# Patient Record
Sex: Female | Born: 1975 | Race: Black or African American | Hispanic: No | Marital: Married | Smoking: Never smoker
Health system: Southern US, Community
[De-identification: ages and names within clinical notes are randomized; demographics above are authoritative.]

## PROBLEM LIST (undated history)

## (undated) DIAGNOSIS — O09529 Supervision of elderly multigravida, unspecified trimester: Secondary | ICD-10-CM

## (undated) DIAGNOSIS — Z8619 Personal history of other infectious and parasitic diseases: Secondary | ICD-10-CM

## (undated) DIAGNOSIS — D369 Benign neoplasm, unspecified site: Secondary | ICD-10-CM

## (undated) HISTORY — DX: Personal history of other infectious and parasitic diseases: Z86.19

## (undated) HISTORY — DX: Supervision of elderly multigravida, unspecified trimester: O09.529

## (undated) HISTORY — PX: OTHER SURGICAL HISTORY: SHX169

## (undated) HISTORY — DX: Benign neoplasm, unspecified site: D36.9

---

## 2005-12-13 ENCOUNTER — Other Ambulatory Visit: Admission: RE | Admit: 2005-12-13 | Discharge: 2005-12-13 | Payer: Self-pay | Admitting: Gynecology

## 2006-12-17 ENCOUNTER — Other Ambulatory Visit: Admission: RE | Admit: 2006-12-17 | Discharge: 2006-12-17 | Payer: Self-pay | Admitting: Gynecology

## 2008-01-10 ENCOUNTER — Other Ambulatory Visit: Admission: RE | Admit: 2008-01-10 | Discharge: 2008-01-10 | Payer: Self-pay | Admitting: Gynecology

## 2008-06-25 ENCOUNTER — Ambulatory Visit: Payer: Self-pay | Admitting: Women's Health

## 2009-02-10 ENCOUNTER — Ambulatory Visit: Payer: Self-pay | Admitting: Women's Health

## 2009-02-10 ENCOUNTER — Encounter: Payer: Self-pay | Admitting: Women's Health

## 2009-02-10 ENCOUNTER — Other Ambulatory Visit: Admission: RE | Admit: 2009-02-10 | Discharge: 2009-02-10 | Payer: Self-pay | Admitting: Gynecology

## 2009-09-22 ENCOUNTER — Emergency Department (HOSPITAL_COMMUNITY): Admission: EM | Admit: 2009-09-22 | Discharge: 2009-09-23 | Payer: Self-pay | Admitting: Emergency Medicine

## 2010-07-01 ENCOUNTER — Ambulatory Visit: Payer: Self-pay | Admitting: Women's Health

## 2010-07-01 ENCOUNTER — Other Ambulatory Visit: Admission: RE | Admit: 2010-07-01 | Discharge: 2010-07-01 | Payer: Self-pay | Admitting: Gynecology

## 2010-08-28 ENCOUNTER — Emergency Department (HOSPITAL_COMMUNITY)
Admission: EM | Admit: 2010-08-28 | Discharge: 2010-08-28 | Payer: Self-pay | Source: Home / Self Care | Admitting: Emergency Medicine

## 2010-09-01 ENCOUNTER — Ambulatory Visit (HOSPITAL_COMMUNITY)
Admission: RE | Admit: 2010-09-01 | Discharge: 2010-09-01 | Payer: Self-pay | Source: Home / Self Care | Attending: Chiropractic Medicine | Admitting: Chiropractic Medicine

## 2010-11-03 LAB — DIFFERENTIAL
Basophils Absolute: 0 10*3/uL (ref 0.0–0.1)
Eosinophils Absolute: 0.1 10*3/uL (ref 0.0–0.7)
Eosinophils Relative: 1 % (ref 0–5)
Lymphs Abs: 3.4 10*3/uL (ref 0.7–4.0)
Monocytes Relative: 10 % (ref 3–12)
Neutro Abs: 2.5 10*3/uL (ref 1.7–7.7)

## 2010-11-03 LAB — BASIC METABOLIC PANEL
Calcium: 9.1 mg/dL (ref 8.4–10.5)
Creatinine, Ser: 0.82 mg/dL (ref 0.4–1.2)
GFR calc Af Amer: >60 mL/min (ref >60)

## 2010-11-03 LAB — CBC
Hemoglobin: 14.3 g/dL (ref 12.0–15.0)
RBC: 4.57 MIL/uL (ref 3.87–5.11)
RDW: 12.8 % (ref 11.5–15.5)
WBC: 6.7 10*3/uL (ref 4.0–10.5)

## 2010-11-03 LAB — URINALYSIS, ROUTINE W REFLEX MICROSCOPIC
Bilirubin Urine: NEGATIVE
Protein, ur: NEGATIVE mg/dL
Specific Gravity, Urine: 1.014 (ref 1.005–1.030)
Urobilinogen, UA: 1 mg/dL (ref 0.0–1.0)

## 2010-11-03 LAB — POCT PREGNANCY, URINE: Preg Test, Ur: NEGATIVE

## 2010-11-03 LAB — URINE MICROSCOPIC-ADD ON

## 2011-10-18 ENCOUNTER — Emergency Department (HOSPITAL_COMMUNITY)
Admission: EM | Admit: 2011-10-18 | Discharge: 2011-10-19 | Disposition: A | Discharge status: Home / Self Care | Payer: BC Managed Care – PPO | Attending: Emergency Medicine | Admitting: Emergency Medicine

## 2011-10-18 ENCOUNTER — Encounter (HOSPITAL_COMMUNITY): Payer: Self-pay | Admitting: Emergency Medicine

## 2011-10-18 DIAGNOSIS — N949 Unspecified condition associated with female genital organs and menstrual cycle: Secondary | ICD-10-CM | POA: Insufficient documentation

## 2011-10-18 DIAGNOSIS — N83201 Unspecified ovarian cyst, right side: Secondary | ICD-10-CM

## 2011-10-18 DIAGNOSIS — N898 Other specified noninflammatory disorders of vagina: Secondary | ICD-10-CM | POA: Insufficient documentation

## 2011-10-18 DIAGNOSIS — N83209 Unspecified ovarian cyst, unspecified side: Secondary | ICD-10-CM | POA: Insufficient documentation

## 2011-10-18 DIAGNOSIS — R102 Pelvic and perineal pain: Secondary | ICD-10-CM

## 2011-10-18 MED ORDER — ONDANSETRON HCL 4 MG/2ML IJ SOLN
4.0000 mg | Freq: Once | INTRAMUSCULAR | Status: AC
  Administered 2011-10-18: 4 mg via INTRAVENOUS
  Filled 2011-10-18: qty 2

## 2011-10-18 MED ORDER — SODIUM CHLORIDE 0.9 % IV SOLN
INTRAVENOUS | Status: DC
  Administered 2011-10-18: via INTRAVENOUS

## 2011-10-18 MED ORDER — HYDROMORPHONE HCL PF 1 MG/ML IJ SOLN
1.0000 mg | Freq: Once | INTRAMUSCULAR | Status: AC
  Administered 2011-10-18: 1 mg via INTRAVENOUS
  Filled 2011-10-18: qty 1

## 2011-10-18 NOTE — ED Notes (Signed)
Pt stated that she could not urinate. 

## 2011-10-18 NOTE — ED Provider Notes (Signed)
History     CSN: [id number]  Arrival date & time 10/18/11  2259   First MD Initiated Contact with Patient 10/18/11 2320      No chief complaint on file.   (Consider location/radiation/quality/duration/timing/severity/associated sxs/prior treatment) HPI Comments: Jessica Cowan is a 36 y.o. Female who developed right low back pain, today, then it radiated to the right lower abdomen. The pain is ongoing and unremitting. She denies change in bowel or urinary habits. Her last menstrual period was one week ago. She has had no fever. She has nausea without vomiting. No diarrhea. She has not tried any medication for the problem. The problem is worse when standing and walking. She has never had it before.  The history is provided by the patient.    History reviewed. No pertinent past medical history.  History reviewed. No pertinent past surgical history.  Family History  Problem Relation Age of Onset  . Hypertension Other   . Diabetes Other   . Hyperlipidemia Other   . Cancer Other     History  Substance Use Topics  . Smoking status: Not on file  . Smokeless tobacco: Not on file  . Alcohol Use: Yes     occassional    OB History    Grav Para Term Preterm Abortions TAB SAB Ect Mult Living                  Review of Systems  All other systems reviewed and are negative.    Allergies  Review of patient's allergies indicates no known allergies.  Home Medications   Current Outpatient Rx  Name Route Sig Dispense Refill  . ACETAMINOPHEN 500 MG PO TABS Oral Take 1,000 mg by mouth every 6 (six) hours as needed.    . SEASONIQUE PO Oral Take 1 tablet by mouth every morning.    . OXYCODONE-ACETAMINOPHEN 5-325 MG PO TABS Oral Take 1 tablet by mouth every 4 (four) hours as needed for pain. 20 tablet 0    BP 106/62  Pulse 77  Temp(Src) 98.9 F (37.2 C) (Oral)  Resp 15  SpO2 100%  LMP 10/11/2011  Physical Exam  Nursing note and vitals reviewed. Constitutional: She is  oriented to person, place, and time. She appears well-developed and well-nourished. She appears distressed.       The patient is very uncomfortable with the pain, rocking, while sitting in bed and when ambulating walks flexed at the waist.  HENT:  Head: Normocephalic and atraumatic.  Eyes: Conjunctivae and EOM are normal. Pupils are equal, round, and reactive to light.  Neck: Normal range of motion and phonation normal. Neck supple.  Cardiovascular: Normal rate, regular rhythm and intact distal pulses.   Pulmonary/Chest: Effort normal and breath sounds normal. She exhibits no tenderness.  Abdominal: Soft. She exhibits no distension. There is no tenderness. There is no guarding.       There is no abdominal tenderness.  Genitourinary: Vaginal discharge (creamy, opaque) found.       No costovertebral angle tenderness. Uterus tender, not enlarged, and left adenexal tenderness, moderate; on bimanual exam.  Musculoskeletal: Normal range of motion.  Neurological: She is alert and oriented to person, place, and time. She has normal strength. She exhibits normal muscle tone.  Skin: Skin is warm and dry.  Psychiatric: She has a normal mood and affect. Her behavior is normal. Judgment and thought content normal.    ED Course  Procedures (including critical care time) Initial impression, urolithiasis with obstruction, versus  PID. Plan imaging with CT if urine pregnancy is negative.  5:39 AM Reevaluation with update and discussion. After initial assessment and treatment, an updated evaluation reveals persistent pain requiring 3rd dose Dilaudid. Pelvic exam done to evaluate pain- more left than right-sided. Pelvic U/S ordered . Stephane Junkins L   5:39 AM Reevaluation with update and discussion. After initial assessment and treatment, an updated evaluation reveals ultrasound results have returned. No evidence of ovarian torsion. Patient is being treated with one more dose of Dilaudid prior to discharge. She  has an appointment with her chronic constant, 9:30 this morning. Johanthan Kneeland L   Labs Reviewed  URINALYSIS, ROUTINE W REFLEX MICROSCOPIC - Abnormal; Notable for the following:    Specific Gravity, Urine 1.039 (*)    Ketones, ur 15 (*)    All other components within normal limits  WET PREP, GENITAL - Abnormal; Notable for the following:    WBC, Wet Prep HPF POC FEW (*)    All other components within normal limits  PREGNANCY, URINE  GC/CHLAMYDIA PROBE AMP, GENITAL   Ct Abdomen Pelvis Wo Contrast  10/19/2011  *RADIOLOGY REPORT*  Clinical Data: Right lower quadrant pain question kidney stone, negative pregnancy test  CT ABDOMEN AND PELVIS WITHOUT CONTRAST  Technique:  Multidetector CT imaging of the abdomen and pelvis was performed following the standard protocol without intravenous contrast. Sagittal and coronal MPR images reconstructed from axial data set.  Comparison: None  Findings: Lung bases clear. Tiny nonobstructing calculi right kidney. Multiple pelvic phleboliths. No definite hydronephrosis, ureteral dilatation, or ureteral calculus. Within limitations of a nonenhanced exam, no additional focal abnormalities of the liver, spleen, pancreas, kidneys, or adrenal glands identified. Bladder decompressed. Cecum located in left upper pelvis. Short segment of normal-appearing appendix noted posterior to cecum in left pelvis.  Small amount of low attenuation cul-de-sac free fluid. Large bilateral fat containing masses identified in the adnexa bilaterally, measuring 9.8 x 7.9 x 7.2 cm diameter right and 5.5 x 4.0 x 5.3 cm diameter left compatible with bilateral ovarian dermoid tumors. Stomach and bowel loops otherwise normal appearance. No hernia or acute bony lesion.  IMPRESSION: Tiny bilateral nonobstructing renal calculi. 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 Small amount of nonspecific free pelvic fluid.  Findings called to Dr. Effie Shy on 10/19/2011 at 0205 hours.  Original Report  Authenticated By: Lollie Marrow, M.D.   US Transvaginal Non-ob  10/19/2011  *RADIOLOGY REPORT*  Clinical Data:  Pelvic pain, bilateral ovarian dermoid tumors  TRANSABDOMINAL AND TRANSVAGINAL ULTRASOUND OF PELVIS DOPPLER ULTRASOUND OF OVARIES  Technique:  Both transabdominal and transvaginal ultrasound examinations of the pelvis were performed. Transabdominal technique was performed for global imaging of the pelvis including uterus, ovaries, adnexal regions, and pelvic cul-de-sac.  It was necessary to proceed with endovaginal exam following the transabdominal exam to visualize the ovaries and endometrium.  Color and duplex Doppler ultrasound was utilized to evaluate blood flow to the ovaries.  Comparison:  CT pelvis 10/19/2011  Findings:  Uterus:  10.0 cm length by 4.0 cm AP by 5.2 cm transverse.  Normal morphology without mass.  Endometrium:  2 mm thick, normal.  Right ovary: Enlarged at 9.5 x 7.6 x 10.0 cm in size secondary to a large complex cystic lesion measuring 8.2 x 6.7 x 7.9 cm in size, corresponding to the dermoid tumor seen on CT.  Blood flow present within the margin of the lesion on color Doppler imaging.  No abnormal appearing right ovarian tissue is identified.  Left ovary:  Enlarged at 6.6 x 3.7 x 6.5 cm secondary to a complex cystic lesion 5.9 x 3.5 x 5.2 cm in size, corresponding to the dermoid tumor seen on CT.  Normal appearing ovarian tissue is seen at the margin of the dermoid tumor.  Blood flow present within left ovary on color Doppler imaging.  Pulsed Doppler evaluation demonstrates normal low-resistance arterial and venous waveforms in the left ovary and within the large complex right ovarian mass.  IMPRESSION: Large bilateral complex ovarian lesions corresponding to the dermoid tumors identified on preceding CT, right larger than left. No evidence of ovarian torsion.  Original Report Authenticated By: Lollie Marrow, M.D.   US Pelvis Complete  10/19/2011  *RADIOLOGY REPORT*  Clinical  Data:  Pelvic pain, bilateral ovarian dermoid tumors  TRANSABDOMINAL AND TRANSVAGINAL ULTRASOUND OF PELVIS DOPPLER ULTRASOUND OF OVARIES  Technique:  Both transabdominal and transvaginal ultrasound examinations of the pelvis were performed. Transabdominal technique was performed for global imaging of the pelvis including uterus, ovaries, adnexal regions, and pelvic cul-de-sac.  It was necessary to proceed with endovaginal exam following the transabdominal exam to visualize the ovaries and endometrium.  Color and duplex Doppler ultrasound was utilized to evaluate blood flow to the ovaries.  Comparison:  CT pelvis 10/19/2011  Findings:  Uterus:  10.0 cm length by 4.0 cm AP by 5.2 cm transverse.  Normal morphology without mass.  Endometrium:  2 mm thick, normal.  Right ovary: Enlarged at 9.5 x 7.6 x 10.0 cm in size secondary to a large complex cystic lesion measuring 8.2 x 6.7 x 7.9 cm in size, corresponding to the dermoid tumor seen on CT.  Blood flow present within the margin of the lesion on color Doppler imaging.  No abnormal appearing right ovarian tissue is identified.  Left ovary:   Enlarged at 6.6 x 3.7 x 6.5 cm secondary to a complex cystic lesion 5.9 x 3.5 x 5.2 cm in size, corresponding to the dermoid tumor seen on CT.  Normal appearing ovarian tissue is seen at the margin of the dermoid tumor.  Blood flow present within left ovary on color Doppler imaging.  Pulsed Doppler evaluation demonstrates normal low-resistance arterial and venous waveforms in the left ovary and within the large complex right ovarian mass.  IMPRESSION: Large bilateral complex ovarian lesions corresponding to the dermoid tumors identified on preceding CT, right larger than left. No evidence of ovarian torsion.  Original Report Authenticated By: Lollie Marrow, M.D.   [place] Art/ven Flow Abd Pelv Doppler  10/19/2011  *RADIOLOGY REPORT*  Clinical Data:  Pelvic pain, bilateral ovarian dermoid tumors  TRANSABDOMINAL AND TRANSVAGINAL  ULTRASOUND OF PELVIS DOPPLER ULTRASOUND OF OVARIES  Technique:  Both transabdominal and transvaginal ultrasound examinations of the pelvis were performed. Transabdominal technique was performed for global imaging of the pelvis including uterus, ovaries, adnexal regions, and pelvic cul-de-sac.  It was necessary to proceed with endovaginal exam following the transabdominal exam to visualize the ovaries and endometrium.  Color and duplex Doppler ultrasound was utilized to evaluate blood flow to the ovaries.  Comparison:  CT pelvis 10/19/2011  Findings:  Uterus:  10.0 cm length by 4.0 cm AP by 5.2 cm transverse.  Normal morphology without mass.  Endometrium:  2 mm thick, normal.  Right ovary: Enlarged at 9.5 x 7.6 x 10.0 cm in size secondary to a large complex cystic lesion measuring 8.2 x 6.7 x 7.9 cm in size, corresponding to the dermoid tumor seen on CT.  Blood flow present within the margin  of the lesion on color Doppler imaging.  No abnormal appearing right ovarian tissue is identified.  Left ovary:   Enlarged at 6.6 x 3.7 x 6.5 cm secondary to a complex cystic lesion 5.9 x 3.5 x 5.2 cm in size, corresponding to the dermoid tumor seen on CT.  Normal appearing ovarian tissue is seen at the margin of the dermoid tumor.  Blood flow present within left ovary on color Doppler imaging.  Pulsed Doppler evaluation demonstrates normal low-resistance arterial and venous waveforms in the left ovary and within the large complex right ovarian mass.  IMPRESSION: Large bilateral complex ovarian lesions corresponding to the dermoid tumors identified on preceding CT, right larger than left. No evidence of ovarian torsion.  Original Report Authenticated By: Lollie Marrow, M.D.     1. Pelvic pain   2. Ovarian cyst, bilateral       MDM  Nonspecific pelvic pain with ovarian cysts. Screening test done for STDs. No evidence for ovarian torsion. Doubt colitis. Patient stable for discharge with outpatient management.  Plan:  Home Medications- Percocet; Home Treatments- Rest; Recommended follow up- GYN today as scheduled        Flint Melter, MD 10/19/11 [phone]

## 2011-10-18 NOTE — ED Notes (Signed)
Pt states she has pain that started in her back this morning and this evening the pain has moved to her lower abdomen  Pt states she is having sharp pains every so often  Denies N/V/D

## 2011-10-19 ENCOUNTER — Encounter: Payer: Self-pay | Admitting: *Deleted

## 2011-10-19 ENCOUNTER — Ambulatory Visit (HOSPITAL_COMMUNITY)
Admission: RE | Admit: 2011-10-19 | Discharge: 2011-10-20 | Disposition: A | Discharge status: Home / Self Care | Payer: BC Managed Care – PPO | Source: Ambulatory Visit | Attending: Gynecology | Admitting: Gynecology

## 2011-10-19 ENCOUNTER — Encounter: Payer: Self-pay | Admitting: Women's Health

## 2011-10-19 ENCOUNTER — Encounter (HOSPITAL_COMMUNITY): Payer: Self-pay | Admitting: Anesthesiology

## 2011-10-19 ENCOUNTER — Ambulatory Visit (INDEPENDENT_AMBULATORY_CARE_PROVIDER_SITE_OTHER): Payer: BC Managed Care – PPO | Admitting: Women's Health

## 2011-10-19 ENCOUNTER — Encounter (HOSPITAL_COMMUNITY): Payer: Self-pay

## 2011-10-19 ENCOUNTER — Ambulatory Visit (HOSPITAL_COMMUNITY): Payer: BC Managed Care – PPO | Admitting: Anesthesiology

## 2011-10-19 ENCOUNTER — Encounter (HOSPITAL_COMMUNITY)
Admission: RE | Disposition: A | Discharge status: Home / Self Care | Payer: Self-pay | Source: Ambulatory Visit | Attending: Gynecology

## 2011-10-19 ENCOUNTER — Emergency Department (HOSPITAL_COMMUNITY): Payer: BC Managed Care – PPO

## 2011-10-19 VITALS — BP 120/80 | Ht 67.5 in | Wt 164.0 lb

## 2011-10-19 DIAGNOSIS — N949 Unspecified condition associated with female genital organs and menstrual cycle: Secondary | ICD-10-CM | POA: Insufficient documentation

## 2011-10-19 DIAGNOSIS — N803 Endometriosis of pelvic peritoneum, unspecified: Secondary | ICD-10-CM | POA: Insufficient documentation

## 2011-10-19 DIAGNOSIS — D369 Benign neoplasm, unspecified site: Secondary | ICD-10-CM | POA: Insufficient documentation

## 2011-10-19 DIAGNOSIS — N83209 Unspecified ovarian cyst, unspecified side: Secondary | ICD-10-CM | POA: Insufficient documentation

## 2011-10-19 DIAGNOSIS — N8353 Torsion of ovary, ovarian pedicle and fallopian tube: Secondary | ICD-10-CM | POA: Insufficient documentation

## 2011-10-19 DIAGNOSIS — R109 Unspecified abdominal pain: Secondary | ICD-10-CM

## 2011-10-19 DIAGNOSIS — D279 Benign neoplasm of unspecified ovary: Secondary | ICD-10-CM

## 2011-10-19 DIAGNOSIS — Z01419 Encounter for gynecological examination (general) (routine) without abnormal findings: Secondary | ICD-10-CM

## 2011-10-19 HISTORY — PX: LAPAROSCOPY: SHX197

## 2011-10-19 LAB — URINALYSIS, ROUTINE W REFLEX MICROSCOPIC
Bilirubin Urine: NEGATIVE
Glucose, UA: NEGATIVE mg/dL
Leukocytes, UA: NEGATIVE
Nitrite: NEGATIVE
Protein, ur: NEGATIVE mg/dL
pH: 7 (ref 5.0–8.0)

## 2011-10-19 LAB — CBC
HCT: 42.2 % (ref 36.0–46.0)
Hemoglobin: 13.3 g/dL (ref 12.0–15.0)
MCH: 30.2 pg (ref 26.0–34.0)
MCHC: 31.5 g/dL (ref 30.0–36.0)
MCV: 95.7 fL (ref 78.0–100.0)

## 2011-10-19 LAB — PREGNANCY, URINE: Preg Test, Ur: NEGATIVE

## 2011-10-19 LAB — WET PREP, GENITAL
Trich, Wet Prep: NONE SEEN
Yeast Wet Prep HPF POC: NONE SEEN

## 2011-10-19 SURGERY — LAPAROSCOPY OPERATIVE
Anesthesia: General | Site: Abdomen | Wound class: Clean Contaminated

## 2011-10-19 MED ORDER — ROCURONIUM BROMIDE 100 MG/10ML IV SOLN
INTRAVENOUS | Status: DC | PRN
  Administered 2011-10-19: 5 mg via INTRAVENOUS
  Administered 2011-10-19: 25 mg via INTRAVENOUS
  Administered 2011-10-19: 5 mg via INTRAVENOUS

## 2011-10-19 MED ORDER — ROCURONIUM BROMIDE 50 MG/5ML IV SOLN
INTRAVENOUS | Status: AC
  Filled 2011-10-19: qty 1

## 2011-10-19 MED ORDER — FENTANYL CITRATE 0.05 MG/ML IJ SOLN
INTRAMUSCULAR | Status: AC
  Filled 2011-10-19: qty 5

## 2011-10-19 MED ORDER — NALOXONE HCL 0.4 MG/ML IJ SOLN: 0.4000 mg | INTRAMUSCULAR | Status: DC | PRN

## 2011-10-19 MED ORDER — ONDANSETRON HCL 4 MG/2ML IJ SOLN
INTRAMUSCULAR | Status: AC
  Filled 2011-10-19: qty 2

## 2011-10-19 MED ORDER — DEXAMETHASONE SODIUM PHOSPHATE 10 MG/ML IJ SOLN
INTRAMUSCULAR | Status: AC
  Filled 2011-10-19: qty 1

## 2011-10-19 MED ORDER — NEOSTIGMINE METHYLSULFATE 1 MG/ML IJ SOLN
INTRAMUSCULAR | Status: DC | PRN
  Administered 2011-10-19: 3 mg via INTRAVENOUS

## 2011-10-19 MED ORDER — CEFAZOLIN SODIUM 1-5 GM-% IV SOLN
1.0000 g | Freq: Once | INTRAVENOUS | Status: AC
  Administered 2011-10-20: 1 g via INTRAVENOUS
  Filled 2011-10-19: qty 50

## 2011-10-19 MED ORDER — MIDAZOLAM HCL 5 MG/5ML IJ SOLN
INTRAMUSCULAR | Status: DC | PRN
  Administered 2011-10-19: 2 mg via INTRAVENOUS

## 2011-10-19 MED ORDER — SUCCINYLCHOLINE CHLORIDE 20 MG/ML IJ SOLN
INTRAMUSCULAR | Status: DC | PRN
  Administered 2011-10-19: 120 mg via INTRAVENOUS

## 2011-10-19 MED ORDER — MUPIROCIN 2 % EX OINT
TOPICAL_OINTMENT | CUTANEOUS | Status: AC
  Filled 2011-10-19: qty 22

## 2011-10-19 MED ORDER — MIDAZOLAM HCL 2 MG/2ML IJ SOLN
INTRAMUSCULAR | Status: AC
  Filled 2011-10-19: qty 2

## 2011-10-19 MED ORDER — HYDROMORPHONE HCL PF 1 MG/ML IJ SOLN
1.0000 mg | Freq: Once | INTRAMUSCULAR | Status: AC
  Administered 2011-10-19: 1 mg via INTRAVENOUS
  Filled 2011-10-19: qty 1

## 2011-10-19 MED ORDER — ONDANSETRON HCL 4 MG/2ML IJ SOLN
INTRAMUSCULAR | Status: AC
  Administered 2011-10-19: 4 mg via INTRAVENOUS
  Filled 2011-10-19: qty 2

## 2011-10-19 MED ORDER — FENTANYL CITRATE 0.05 MG/ML IJ SOLN
INTRAMUSCULAR | Status: AC
  Filled 2011-10-19: qty 2

## 2011-10-19 MED ORDER — FENTANYL CITRATE 0.05 MG/ML IJ SOLN
25.0000 ug | INTRAMUSCULAR | Status: DC | PRN
  Administered 2011-10-19 (×2): 50 ug via INTRAVENOUS

## 2011-10-19 MED ORDER — CEFAZOLIN SODIUM 1-5 GM-% IV SOLN
1.0000 g | INTRAVENOUS | Status: AC
  Administered 2011-10-19: 1 g via INTRAVENOUS

## 2011-10-19 MED ORDER — MORPHINE SULFATE (PF) 1 MG/ML IV SOLN
INTRAVENOUS | Status: DC
  Administered 2011-10-19: 3 mg via INTRAVENOUS
  Administered 2011-10-19: 21:00:00 via INTRAVENOUS
  Administered 2011-10-20: 1 mg via INTRAVENOUS
  Filled 2011-10-19: qty 25

## 2011-10-19 MED ORDER — BUPIVACAINE HCL (PF) 0.25 % IJ SOLN
INTRAMUSCULAR | Status: AC
  Filled 2011-10-19: qty 30

## 2011-10-19 MED ORDER — GLYCOPYRROLATE 0.2 MG/ML IJ SOLN
INTRAMUSCULAR | Status: AC
  Filled 2011-10-19: qty 1

## 2011-10-19 MED ORDER — FENTANYL CITRATE 0.05 MG/ML IJ SOLN
INTRAMUSCULAR | Status: DC | PRN
  Administered 2011-10-19: 50 ug via INTRAVENOUS
  Administered 2011-10-19: 100 ug via INTRAVENOUS
  Administered 2011-10-19 (×2): 50 ug via INTRAVENOUS
  Administered 2011-10-19: 100 ug via INTRAVENOUS

## 2011-10-19 MED ORDER — KETOROLAC TROMETHAMINE 30 MG/ML IJ SOLN
INTRAMUSCULAR | Status: DC | PRN
  Administered 2011-10-19: 30 mg via INTRAVENOUS

## 2011-10-19 MED ORDER — ACETAMINOPHEN 325 MG PO TABS: 325.0000 mg | ORAL_TABLET | ORAL | Status: DC | PRN

## 2011-10-19 MED ORDER — LIDOCAINE HCL (CARDIAC) 20 MG/ML IV SOLN
INTRAVENOUS | Status: DC | PRN
  Administered 2011-10-19: 50 mg via INTRAVENOUS

## 2011-10-19 MED ORDER — LACTATED RINGERS IV SOLN
INTRAVENOUS | Status: DC | PRN
  Administered 2011-10-19 (×2): via INTRAVENOUS

## 2011-10-19 MED ORDER — ONDANSETRON HCL 4 MG/2ML IJ SOLN
4.0000 mg | Freq: Once | INTRAMUSCULAR | Status: AC
  Administered 2011-10-19: 4 mg via INTRAVENOUS
  Filled 2011-10-19: qty 2

## 2011-10-19 MED ORDER — DEXAMETHASONE SODIUM PHOSPHATE 4 MG/ML IJ SOLN
INTRAMUSCULAR | Status: DC | PRN
  Administered 2011-10-19: 5 mg via INTRAVENOUS

## 2011-10-19 MED ORDER — OXYCODONE-ACETAMINOPHEN 5-325 MG PO TABS: 1.0000 | ORAL_TABLET | ORAL | Status: DC | PRN

## 2011-10-19 MED ORDER — DIPHENHYDRAMINE HCL 12.5 MG/5ML PO ELIX
12.5000 mg | ORAL_SOLUTION | Freq: Four times a day (QID) | ORAL | Status: DC | PRN
  Filled 2011-10-19: qty 5

## 2011-10-19 MED ORDER — ONDANSETRON HCL 4 MG/2ML IJ SOLN: 4.0000 mg | Freq: Four times a day (QID) | INTRAMUSCULAR | Status: DC | PRN

## 2011-10-19 MED ORDER — HEPARIN SODIUM (PORCINE) 5000 UNIT/ML IJ SOLN
INTRAMUSCULAR | Status: DC | PRN
  Administered 2011-10-19: 5000 [IU]

## 2011-10-19 MED ORDER — KETOROLAC TROMETHAMINE 30 MG/ML IJ SOLN
INTRAMUSCULAR | Status: AC
  Filled 2011-10-19: qty 1

## 2011-10-19 MED ORDER — PROPOFOL 10 MG/ML IV EMUL
INTRAVENOUS | Status: DC | PRN
  Administered 2011-10-19: 160 mg via INTRAVENOUS

## 2011-10-19 MED ORDER — CEFAZOLIN SODIUM 1-5 GM-% IV SOLN
INTRAVENOUS | Status: AC
  Filled 2011-10-19: qty 50

## 2011-10-19 MED ORDER — DIPHENHYDRAMINE HCL 50 MG/ML IJ SOLN: 12.5000 mg | Freq: Four times a day (QID) | INTRAMUSCULAR | Status: DC | PRN

## 2011-10-19 MED ORDER — BUPIVACAINE HCL (PF) 0.25 % IJ SOLN
INTRAMUSCULAR | Status: DC | PRN
  Administered 2011-10-19: 10 mL

## 2011-10-19 MED ORDER — LACTATED RINGERS IV SOLN
INTRAVENOUS | Status: DC
  Administered 2011-10-19: 14:00:00 via INTRAVENOUS

## 2011-10-19 MED ORDER — LACTATED RINGERS IR SOLN
Status: DC | PRN
  Administered 2011-10-19: 6000 mL
  Administered 2011-10-19: 9000 mL

## 2011-10-19 MED ORDER — ONDANSETRON HCL 4 MG/2ML IJ SOLN
INTRAMUSCULAR | Status: DC | PRN
  Administered 2011-10-19: 4 mg via INTRAVENOUS

## 2011-10-19 MED ORDER — METHYLENE BLUE 1 % INJ SOLN
INTRAMUSCULAR | Status: AC
  Filled 2011-10-19: qty 1

## 2011-10-19 MED ORDER — SODIUM CHLORIDE 0.9 % IJ SOLN: 9.0000 mL | INTRAMUSCULAR | Status: DC | PRN

## 2011-10-19 MED ORDER — GLYCOPYRROLATE 0.2 MG/ML IJ SOLN
INTRAMUSCULAR | Status: DC | PRN
  Administered 2011-10-19: 0.4 mg via INTRAVENOUS

## 2011-10-19 MED ORDER — LACTATED RINGERS IV SOLN
INTRAVENOUS | Status: DC
  Administered 2011-10-19 – 2011-10-20 (×2): via INTRAVENOUS

## 2011-10-19 MED ORDER — ONDANSETRON HCL 4 MG/2ML IJ SOLN
4.0000 mg | Freq: Once | INTRAMUSCULAR | Status: AC
  Administered 2011-10-19: 4 mg via INTRAVENOUS

## 2011-10-19 MED ORDER — NEOSTIGMINE METHYLSULFATE 1 MG/ML IJ SOLN
INTRAMUSCULAR | Status: AC
  Filled 2011-10-19: qty 10

## 2011-10-19 MED ORDER — PROPOFOL 10 MG/ML IV EMUL
INTRAVENOUS | Status: AC
  Filled 2011-10-19: qty 20

## 2011-10-19 MED ORDER — KETOROLAC TROMETHAMINE 30 MG/ML IJ SOLN: 15.0000 mg | Freq: Once | INTRAMUSCULAR | Status: DC | PRN

## 2011-10-19 SURGICAL SUPPLY — 33 items
ADH SKN CLS APL DERMABOND .7 (GAUZE/BANDAGES/DRESSINGS)
BAG SPEC RTRVL LRG 6X4 10 (ENDOMECHANICALS) ×2
BARRIER ADHS 3X4 INTERCEED (GAUZE/BANDAGES/DRESSINGS) IMPLANT
BLADE SURG 15 STRL LF C SS BP (BLADE) ×1 IMPLANT
BLADE SURG 15 STRL SS (BLADE) ×2
BRR ADH 4X3 ABS CNTRL BYND (GAUZE/BANDAGES/DRESSINGS)
CABLE HIGH FREQUENCY MONO STRZ (ELECTRODE) IMPLANT
CANISTER SUCTION 2500CC (MISCELLANEOUS) ×6 IMPLANT
CLOTH BEACON ORANGE TIMEOUT ST (SAFETY) ×2 IMPLANT
COVER TABLE BACK 60X90 (DRAPES) ×1 IMPLANT
DERMABOND ADVANCED (GAUZE/BANDAGES/DRESSINGS)
DERMABOND ADVANCED .7 DNX12 (GAUZE/BANDAGES/DRESSINGS) IMPLANT
FILTER SMOKE EVAC LAPAROSHD (FILTER) ×1 IMPLANT
GLOVE BIOGEL PI IND STRL 8 (GLOVE) ×1 IMPLANT
GLOVE BIOGEL PI INDICATOR 8 (GLOVE) ×1
GLOVE ECLIPSE 7.5 STRL STRAW (GLOVE) ×4 IMPLANT
GOWN PREVENTION PLUS LG XLONG (DISPOSABLE) ×6 IMPLANT
NS IRRIG 1000ML POUR BTL (IV SOLUTION) ×1 IMPLANT
PACK LAPAROSCOPY BASIN (CUSTOM PROCEDURE TRAY) ×2 IMPLANT
POUCH SPECIMEN RETRIEVAL 10MM (ENDOMECHANICALS) ×2 IMPLANT
PROTECTOR NERVE ULNAR (MISCELLANEOUS) ×3 IMPLANT
SCALPEL HARMONIC ACE (MISCELLANEOUS) ×1 IMPLANT
SET IRRIG TUBING LAPAROSCOPIC (IRRIGATION / IRRIGATOR) ×1 IMPLANT
SLEEVE Z-THREAD 5X100MM (TROCAR) ×1 IMPLANT
SOLUTION ELECTROLUBE (MISCELLANEOUS) IMPLANT
SUT PLAIN 4 0 FS 2 27 (SUTURE) ×2 IMPLANT
SUT VIC AB 3-0 PS2 18 (SUTURE) ×2
SUT VIC AB 3-0 PS2 18XBRD (SUTURE) ×1 IMPLANT
SUT VICRYL 0 UR6 27IN ABS (SUTURE) ×2 IMPLANT
TOWEL OR 17X24 6PK STRL BLUE (TOWEL DISPOSABLE) ×4 IMPLANT
TRAY FOLEY CATH 14FR (SET/KITS/TRAYS/PACK) ×2 IMPLANT
TROCAR Z-THREAD FIOS 11X100 BL (TROCAR) ×3 IMPLANT
TROCAR Z-THREAD FIOS 5X100MM (TROCAR) ×2 IMPLANT

## 2011-10-19 NOTE — Progress Notes (Signed)
Jessica Cowan Jul 27, 1976 [id number]    History:    The patient presents for annual exam.  Contraceptives on Seasonale with cycle every 3 months. State cycles have become heavier with increasing dysmenorrhea. Was seen at [facility] last evening for severe right-sided lower abdominal pain. CT scan showed normal appendix, several nonobstructing renal calculi, 2 dermoid-type ovarian cysts largest being on the right 9.8x7.9x7.2 cm, left dermoid cyst 5.5x4.0x5.3cm. States pain started yesterday a.m. and got progressively worse throughout the day.   Past medical history, past surgical history, family history and social history were all reviewed and documented in the EPIC chart. 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 56 year old son doing well also has 3 stepchildren also doing well.   ROS:  A  ROS was performed and pertinent positives and negatives are included in the history.  Exam:  Filed Vitals:   10/19/11 0941  BP: 120/80    General appearance:  Normal but does appear uncomfortable  Head/Neck:  Normal, without cervical or supraclavicular adenopathy. Thyroid:  Symmetrical, normal in size, without palpable masses or nodularity. Respiratory  Effort:  Normal  Auscultation:  Clear without wheezing or rhonchi Cardiovascular  Auscultation:  Regular rate, without rubs, murmurs or gallops  Edema/varicosities:  Not grossly evident Abdominal  Soft,nontender, without masses, guarding or rebound.  Liver/spleen:  No organomegaly noted  Hernia:  None appreciated  Skin  Inspection:  Grossly normal  Palpation:  Grossly normal Neurologic/psychiatric  Orientation:  Normal with appropriate conversation.  Mood/affect:  Normal  Genitourinary    Breasts: Examined lying and sitting.     Right: Without masses, retractions, discharge or axillary adenopathy.     Left: Without masses, retractions, discharge or axillary adenopathy.   Inguinal/mons:  Normal without inguinal  adenopathy  External genitalia:  Normal  BUS/Urethra/Skene's glands:  Normal  Bladder:  Normal  Vagina:  Normal  Cervix:  Normal  Uterus:   normal in size, shape and contour.  Midline and mobile  Adnexa/parametria:     Rt: Palpable enlarged ovary- tender    Lt: Palpable 4-5 cm ovary with tenderness  Anus and perineum: Normal  Digital rectal exam: Normal sphincter tone without palpated masses or tenderness  Assessment/Plan:  36 y.o. MBF G1P1 for annual exam and right lower quadrant pain.  Bilateral dermoid cysts with acute lower abdominal pain  Plan: Seasonale prescription, proper use, slight risk for blood clots and strokes given and reviewed. SBE's, exercise, calcium rich diet, MVI daily encouraged. Dr. Lily Peer in and examined, laparoscopy for dermoid removal discussed and planned. Wants to keep fertility  ability intact declines BTL. Dr. Lily Peer reviewed surgical risks of infection, hemorrhage, perforation and possible fertility. Will proceed with laparoscopy for dermoid removal. No labs    [facility], 12:28 PM 10/19/2011

## 2011-10-19 NOTE — Op Note (Signed)
10/19/2011  5:41 PM  PATIENT:  Jessica Cowan  36 y.o. female  PRE-OPERATIVE DIAGNOSIS:  bilateral ovarian cysts.Marland Kitchenone 10cm and the other side 8cm. PELVIC PAIN  POST-OPERATIVE DIAGNOSIS: Bilateral dermoid cysts.  Cul-de-sac endometriotic implants. Right adnexal torsion  PROCEDURE:  Procedure(s): LAPAROSCOPY OPERATIVE  SURGEON:  Surgeon(s): Ok Edwards, MD Dara Lords, MD  ANESTHESIA:   general  FINDINGS: Torsion of right adnexa was noted to would decrease blood flow and necrotic in appearance. Large 9 cm right ovarian cyst and 7-8 cm left ovarian cyst. Contralateral fallopian tube normal. Small endometriotic implant left uterosacral ligament. Normal-appearing appendix.  DESCRIPTION OF OPERATION: This patient was taken to the operating room where she underwent a successful general endotracheal anesthesia. Her abdomen was prepped and draped in the usual sterile fashion Patient received a gram of Cefotan for prophylaxis and had PAS stockings as well. She was placed in low lithotomy position bimanual exam confirmed bilateral adnexal masses uterus slightly anteverted. A Hulka tenaculum was placed for manipulation during laparoscopic procedure. A Foley catheter was placed for monitoring position urinary output. The single-tooth tenaculum was then removed. The drapes were then placed. A small subumbilical incision was made and a 10/11 mm Optiview trocar was inserted. 2 additional 5 mm ports were made in the right and left lower abdomen under laparoscopic guidance after pneumoperitoneum had been established. It was noted at this time that the right infundibulopelvic ligament was torsed as a result of the large right ovarian cyst. The right infundibulopelvic ligament was de-torsed and circulation was found to once again reached the right fallopian tube and ovary. With a Harmonic scalpel back blade the ovarian capsule was incised in a circumferential manner to free the ovarian cyst. It was at  this time that the cyst had collapsed and mucinous material in the hairline products began to extrude. The pelvic cavity was copious irrigated and the cyst wall was removed with its contents and placed on the anterior cul-de-sac as we prepared to remove the left ovarian cyst. With the use of the Harmonic Scalpel back blade an incision was made on the ovarian capsule and once again incised in a circumferential fashion to free the left cyst but again ruptured and mucinous and hairy-like material began to extrude. Once again copious irrigation was undertaken. In the cyst wall and its contents were placed on the anterior cul-de-sac as well. A 5 mm hysteroscope was placed on the right lower port and through the subumbilical port the Endopouch was introduced and portions of right and left fallopian tube and part of ovarian capsule were placed in the bag and removed and passed off for histological evaluation. Approximately 9 L of lactated Ringer's was utilized to irrigate the abdominal pelvic cavity. Inspection of both ovarian capsules demonstrated good hemostasis a few the edges were Bovie cauterized. The appendix appeared to be normal in appearance. The pneumoperitoneum was removed as were the trochars. Both subumbilical incision and the right lower port fascia was reapproximated with 0 Vicryl suture. The skin edges of all 3 were reapproximated with Dermabond glue for postoperative analgesia quarter percent Marcaine was infiltrated in all 3 port sites for a total of 10 cc. The Hulka tenaculum was removed. Silver nitrate was placed on the anterior cervical lip which was oozing a little bit. Patient received 30 mg of Toradol IV in route to the recovery room.   ESTIMATED BLOOD LOSS: Minimal  Intake/Output Summary (Last 24 hours) at 10/19/11 1741 Last data filed at 10/19/11 1535  Gross per 24 hour  Intake   1000 ml  Output      0 ml  Net   1000 ml     BLOOD ADMINISTERED:none   LOCAL MEDICATIONS USED:  MARCAINE    0.25% subcutaneous port sites (10 cc) SPECIMEN:  Source of Specimen:  Right and left ovarian cyst and pelvic washings  DISPOSITION OF SPECIMEN:  PATHOLOGY  COUNTS:  YES  PLAN OF CARE: Transfer to PACU  [facility] HMD5:41 PMTD@

## 2011-10-19 NOTE — Transfer of Care (Signed)
Immediate Anesthesia Transfer of Care Note  Patient: Jessica Cowan  Procedure(s) Performed: Procedure(s) (LRB): LAPAROSCOPY OPERATIVE (N/A)  Patient Location: PACU  Anesthesia Type: General  Level of Consciousness: awake, alert  and oriented  Airway & Oxygen Therapy: Patient Spontanous Breathing and Patient connected to nasal cannula oxygen  Post-op Assessment: Report given to PACU RN and Post -op Vital signs reviewed and stable  Post vital signs: Reviewed and stable  Complications: No apparent anesthesia complications

## 2011-10-19 NOTE — ED Notes (Signed)
Patient returned from US.

## 2011-10-19 NOTE — H&P (Signed)
Jessica Cowan is an 36 y.o. female. Gravida 1 para 1 who was seen in the emergency room last night complaining of right lower abdominal pain. She stated early that morning her pain began her back radiating to her right side. Patient is on oral contraceptive pills for contraception and is having normal menstrual cycle. She's had one child delivered vaginally. Patient had a pelvic CT to rule out appendicitis and also had a transabdominal and transvaginal ultrasound see report below. Patient with bilateral adnexal cysts suspicious for dermoid. Patient Her pain will be taken to the operating room this afternoon for attempt at laparoscopic ovarian cystectomy possible laparotomy.  Pertinent Gynecological History: Menses: Regular Bleeding: Regular  Contraception: OCP (estrogen/progesterone) DES exposure: denies Blood transfusions: none Sexually transmitted diseases: no past history Previous GYN Procedures: None except normal spontaneous vaginal delivery  Last mammogram: Not indicated Date: Not indicated  Last pap: normal Date: 2011 OB History: G 1, P 1   Menstrual History: Menarche age: 73 Patient's last menstrual period was 10/11/2011.    No past medical history on file.  No past surgical history on file.  Family History  Problem Relation Age of Onset  . Hypertension Other   . Diabetes Other   . Hyperlipidemia Other   . Cancer Other   . Diabetes Father   . Hypertension Father   . Cancer Paternal Grandmother     bone  . Cancer Paternal Grandfather     intestional cancer    Social History:  reports that she has never smoked. She has never used smokeless tobacco. She reports that she drinks alcohol. She reports that she does not use illicit drugs.  Allergies: No Known Allergies  No prescriptions prior to admission    @ROS @  Last menstrual period 10/11/2011.  Physical Exam:  HEENT:unremarkable Neck:Supple, midline, no thyroid megaly, no carotid bruits Lungs:  Clear to  auscultation no rhonchi's or wheezes Heart:Regular rate and rhythm, no murmurs or gallops Breast Exam: Not examined Abdomen: Tender lower abdomen right greater than left Pelvic:BUS unremarkable Vagina: No lesions or discharge Cervix: No lesion discharge Uterus: Tenderness right greater than left Adnexa: Bilateral adnexal fullness right greater than left Extremities: No cords, no edema Rectal: Not done  Results for orders placed during the hospital encounter of 10/18/11 (from the past 24 hour(s))  URINALYSIS, ROUTINE W REFLEX MICROSCOPIC     Status: Abnormal   Collection Time   10/19/11  1:00 AM      Component Value Range   Color, Urine YELLOW  YELLOW    APPearance CLEAR  CLEAR    Specific Gravity, Urine 1.039 (*) 1.005 - 1.030    pH 7.0  5.0 - 8.0    Glucose, UA NEGATIVE  NEGATIVE (mg/dL)   Hgb urine dipstick NEGATIVE  NEGATIVE    Bilirubin Urine NEGATIVE  NEGATIVE    Ketones, ur 15 (*) NEGATIVE (mg/dL)   Protein, ur NEGATIVE  NEGATIVE (mg/dL)   Urobilinogen, UA 1.0  0.0 - 1.0 (mg/dL)   Nitrite NEGATIVE  NEGATIVE    Leukocytes, UA NEGATIVE  NEGATIVE   PREGNANCY, URINE     Status: Normal   Collection Time   10/19/11  1:00 AM      Component Value Range   Preg Test, Ur NEGATIVE  NEGATIVE   WET PREP, GENITAL     Status: Abnormal   Collection Time   10/19/11  2:45 AM      Component Value Range   Yeast Wet Prep HPF POC NONE  SEEN  NONE SEEN    Trich, Wet Prep NONE SEEN  NONE SEEN    Clue Cells Wet Prep HPF POC NONE SEEN  NONE SEEN    WBC, Wet Prep HPF POC FEW (*) NONE SEEN     Ct Abdomen Pelvis Wo Contrast  10/19/2011  *RADIOLOGY REPORT*  Clinical Data: Right lower quadrant pain question kidney stone, negative pregnancy test  CT ABDOMEN AND PELVIS WITHOUT CONTRAST  Technique:  Multidetector CT imaging of the abdomen and pelvis was performed following the standard protocol without intravenous contrast. Sagittal and coronal MPR images reconstructed from axial data set.  Comparison: None   Findings: Lung bases clear. Tiny nonobstructing calculi right kidney. Multiple pelvic phleboliths. No definite hydronephrosis, ureteral dilatation, or ureteral calculus. Within limitations of a nonenhanced exam, no additional focal abnormalities of the liver, spleen, pancreas, kidneys, or adrenal glands identified. Bladder decompressed. Cecum located in left upper pelvis. Short segment of normal-appearing appendix noted posterior to cecum in left pelvis.  Small amount of low attenuation cul-de-sac free fluid. Large bilateral fat containing masses identified in the adnexa bilaterally, measuring 9.8 x 7.9 x 7.2 cm diameter right and 5.5 x 4.0 x 5.3 cm diameter left compatible with bilateral ovarian dermoid tumors. Stomach and bowel loops otherwise normal appearance. No hernia or acute bony lesion.  IMPRESSION: Tiny bilateral nonobstructing renal calculi. 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 Small amount of nonspecific free pelvic fluid.  Findings called to Dr. Effie Shy on 10/19/2011 at 0205 hours.  Original Report Authenticated By: Lollie Marrow, M.D.   US Transvaginal Non-ob  10/19/2011  *RADIOLOGY REPORT*  Clinical Data:  Pelvic pain, bilateral ovarian dermoid tumors  TRANSABDOMINAL AND TRANSVAGINAL ULTRASOUND OF PELVIS DOPPLER ULTRASOUND OF OVARIES  Technique:  Both transabdominal and transvaginal ultrasound examinations of the pelvis were performed. Transabdominal technique was performed for global imaging of the pelvis including uterus, ovaries, adnexal regions, and pelvic cul-de-sac.  It was necessary to proceed with endovaginal exam following the transabdominal exam to visualize the ovaries and endometrium.  Color and duplex Doppler ultrasound was utilized to evaluate blood flow to the ovaries.  Comparison:  CT pelvis 10/19/2011  Findings:  Uterus:  10.0 cm length by 4.0 cm AP by 5.2 cm transverse.  Normal morphology without mass.  Endometrium:  2 mm thick, normal.  Right ovary: Enlarged at 9.5 x  7.6 x 10.0 cm in size secondary to a large complex cystic lesion measuring 8.2 x 6.7 x 7.9 cm in size, corresponding to the dermoid tumor seen on CT.  Blood flow present within the margin of the lesion on color Doppler imaging.  No abnormal appearing right ovarian tissue is identified.  Left ovary:   Enlarged at 6.6 x 3.7 x 6.5 cm secondary to a complex cystic lesion 5.9 x 3.5 x 5.2 cm in size, corresponding to the dermoid tumor seen on CT.  Normal appearing ovarian tissue is seen at the margin of the dermoid tumor.  Blood flow present within left ovary on color Doppler imaging.  Pulsed Doppler evaluation demonstrates normal low-resistance arterial and venous waveforms in the left ovary and within the large complex right ovarian mass.  IMPRESSION: Large bilateral complex ovarian lesions corresponding to the dermoid tumors identified on preceding CT, right larger than left. No evidence of ovarian torsion.  Original Report Authenticated By: Lollie Marrow, M.D.   US Pelvis Complete  10/19/2011  *RADIOLOGY REPORT*  Clinical Data:  Pelvic pain, bilateral ovarian dermoid tumors  TRANSABDOMINAL AND TRANSVAGINAL  ULTRASOUND OF PELVIS DOPPLER ULTRASOUND OF OVARIES  Technique:  Both transabdominal and transvaginal ultrasound examinations of the pelvis were performed. Transabdominal technique was performed for global imaging of the pelvis including uterus, ovaries, adnexal regions, and pelvic cul-de-sac.  It was necessary to proceed with endovaginal exam following the transabdominal exam to visualize the ovaries and endometrium.  Color and duplex Doppler ultrasound was utilized to evaluate blood flow to the ovaries.  Comparison:  CT pelvis 10/19/2011  Findings:  Uterus:  10.0 cm length by 4.0 cm AP by 5.2 cm transverse.  Normal morphology without mass.  Endometrium:  2 mm thick, normal.  Right ovary: Enlarged at 9.5 x 7.6 x 10.0 cm in size secondary to a large complex cystic lesion measuring 8.2 x 6.7 x 7.9 cm in size,  corresponding to the dermoid tumor seen on CT.  Blood flow present within the margin of the lesion on color Doppler imaging.  No abnormal appearing right ovarian tissue is identified.  Left ovary:   Enlarged at 6.6 x 3.7 x 6.5 cm secondary to a complex cystic lesion 5.9 x 3.5 x 5.2 cm in size, corresponding to the dermoid tumor seen on CT.  Normal appearing ovarian tissue is seen at the margin of the dermoid tumor.  Blood flow present within left ovary on color Doppler imaging.  Pulsed Doppler evaluation demonstrates normal low-resistance arterial and venous waveforms in the left ovary and within the large complex right ovarian mass.  IMPRESSION: Large bilateral complex ovarian lesions corresponding to the dermoid tumors identified on preceding CT, right larger than left. No evidence of ovarian torsion.  Original Report Authenticated By: Lollie Marrow, M.D.   [place] Art/ven Flow Abd Pelv Doppler  10/19/2011  *RADIOLOGY REPORT*  Clinical Data:  Pelvic pain, bilateral ovarian dermoid tumors  TRANSABDOMINAL AND TRANSVAGINAL ULTRASOUND OF PELVIS DOPPLER ULTRASOUND OF OVARIES  Technique:  Both transabdominal and transvaginal ultrasound examinations of the pelvis were performed. Transabdominal technique was performed for global imaging of the pelvis including uterus, ovaries, adnexal regions, and pelvic cul-de-sac.  It was necessary to proceed with endovaginal exam following the transabdominal exam to visualize the ovaries and endometrium.  Color and duplex Doppler ultrasound was utilized to evaluate blood flow to the ovaries.  Comparison:  CT pelvis 10/19/2011  Findings:  Uterus:  10.0 cm length by 4.0 cm AP by 5.2 cm transverse.  Normal morphology without mass.  Endometrium:  2 mm thick, normal.  Right ovary: Enlarged at 9.5 x 7.6 x 10.0 cm in size secondary to a large complex cystic lesion measuring 8.2 x 6.7 x 7.9 cm in size, corresponding to the dermoid tumor seen on CT.  Blood flow present within the margin of the  lesion on color Doppler imaging.  No abnormal appearing right ovarian tissue is identified.  Left ovary:   Enlarged at 6.6 x 3.7 x 6.5 cm secondary to a complex cystic lesion 5.9 x 3.5 x 5.2 cm in size, corresponding to the dermoid tumor seen on CT.  Normal appearing ovarian tissue is seen at the margin of the dermoid tumor.  Blood flow present within left ovary on color Doppler imaging.  Pulsed Doppler evaluation demonstrates normal low-resistance arterial and venous waveforms in the left ovary and within the large complex right ovarian mass.  IMPRESSION: Large bilateral complex ovarian lesions corresponding to the dermoid tumors identified on preceding CT, right larger than left. No evidence of ovarian torsion.  Original Report Authenticated By: Lollie Marrow, M.D.  Assessment/Plan: Patient with bilateral adnexal masses suspicious for dermoid cyst. We'll attempt laparoscopic removal of both ovarian cyst is not patient will be counseled for an open laparotomy. Patient fully aware that she may lose one or both ovaries. The other risk of the operation include the following: Infection (she will receive prophylaxis antibiotic), deep venous thrombosis and pulmonary embolism (PAS stockings will be placed prophylactically), in the event of hemorrhage and she were to need blood or blood products potential risk of anaphylactic reaction hepatitis or AIDS were also discussed. And also in the event of trauma or injury to internal organs or in the event that the operations not being able to be completed laparoscopically we'll need to proceed with an open laparotomy technique to remove both cyst and she could lose one or both ovaries. If so she will not be able to have any more children. She is fully aware of all the above all questions rancher will follow accordingly.   Ok Edwards 10/19/2011, 12:19 PM

## 2011-10-19 NOTE — Anesthesia Preprocedure Evaluation (Addendum)
Anesthesia Evaluation  Patient identified by MRN, date of birth, ID band Patient awake    Reviewed: Allergy & Precautions, H&P , Patient's Chart, lab work & pertinent test results, reviewed documented beta blocker date and time   History of Anesthesia Complications Negative for: history of anesthetic complications  Airway Mallampati: II TM Distance: >3 FB Neck ROM: full    Dental No notable dental hx.    Pulmonary neg pulmonary ROS,  breath sounds clear to auscultation  Pulmonary exam normal       Cardiovascular Exercise Tolerance: Good negative cardio ROS  Rhythm:regular Rate:Normal     Neuro/Psych negative neurological ROS  negative psych ROS   GI/Hepatic negative GI ROS, Neg liver ROS,   Endo/Other  negative endocrine ROS  Renal/GU negative Renal ROS     Musculoskeletal   Abdominal   Peds  Hematology negative hematology ROS (+)   Anesthesia Other Findings nausea  Reproductive/Obstetrics negative OB ROS                          Anesthesia Physical Anesthesia Plan  ASA: I  Anesthesia Plan: General ETT   Post-op Pain Management:    Induction: Rapid sequence and Intravenous  Airway Management Planned: Oral ETT  Additional Equipment:   Intra-op Plan:   Post-operative Plan:   Informed Consent: I have reviewed the patients History and Physical, chart, labs and discussed the procedure including the risks, benefits and alternatives for the proposed anesthesia with the patient or authorized representative who has indicated his/her understanding and acceptance.   Dental Advisory Given  Plan Discussed with: CRNA and Surgeon  Anesthesia Plan Comments:        Anesthesia Quick Evaluation

## 2011-10-19 NOTE — Interval H&P Note (Signed)
History and Physical Interval Note:  10/19/2011 2:55 PM  Jessica Cowan  has presented today for surgery, with the diagnosis of bilateral ovarian cysts.Marland Kitchenone 10cm and the other side 8cm. PELVIC PAIN  The various methods of treatment have been discussed with the patient and family. After consideration of risks, benefits and other options for treatment, the patient has consented to  Procedure(s) (LRB):bilateral ovarian cystectomy, possible laparatomy LAPAROSCOPY OPERATIVE (N/A) as a surgical intervention .  The patients' history has been reviewed, patient examined, no change in status, stable for surgery.  I have reviewed the patients' chart and labs.  Questions were answered to the patient's satisfaction.     Ok Edwards

## 2011-10-19 NOTE — Anesthesia Postprocedure Evaluation (Signed)
Anesthesia Post Note  Patient: Jessica Cowan  Procedure(s) Performed: Procedure(s) (LRB): LAPAROSCOPY OPERATIVE (N/A)  Anesthesia type: GA  Patient location: PACU  Post pain: Pain level controlled  Post assessment: Post-op Vital signs reviewed  Last Vitals:  Filed Vitals:   10/19/11 1745  BP: 106/70  Pulse:   Temp:   Resp: 20    Post vital signs: Reviewed  Level of consciousness: sedated  Complications: No apparent anesthesia complications

## 2011-10-19 NOTE — Discharge Instructions (Signed)
Ovarian Cyst An ovarian cyst is a sac filled with fluid or blood. This sac is attached to the ovary. Some cysts go away on their own. Other cysts need treatment.  HOME CARE   Only take medicine as told by your doctor.   Follow up with your doctor as told.  GET HELP RIGHT AWAY IF:   You develop sudden pain.   Your belly (abdomen) becomes large or puffy (swollen).   You have a hard time peeing (totally emptying your bladder).   You feel sick most of the time.   You have a temperature by mouth above 102 F (38.9 C), not controlled by medicine.   Your periods are late, not regular, or painful.   Your belly or pelvic pain does not go away.   You have pressure on your bladder.   You have pain during sex.   You feel fullness, pressure, or discomfort in your belly.   You lose weight for no reason.  MAKE SURE YOU:   Understand these instructions.   Will watch your condition.   Will get help right away if you are not doing well or get worse.  Document Released: 01/17/2008 Document Revised: 07/20/2011 Document Reviewed: 07/02/2009 [employer] Patient Information 2012 [employer]. 

## 2011-10-19 NOTE — ED Notes (Signed)
MD at bedside. Dr. Effie Shy at bedside. Pt to be d/c'ed.

## 2011-10-19 NOTE — Progress Notes (Addendum)
Told by Dr. Lily Peer  To d/c pca  At 6 am along with foley, he will place order for percocet

## 2011-10-20 LAB — CBC
HCT: 37.4 % (ref 36.0–46.0)
MCV: 95.9 fL (ref 78.0–100.0)
Platelets: 236 10*3/uL (ref 150–400)
RBC: 3.9 MIL/uL (ref 3.87–5.11)
WBC: 8.8 10*3/uL (ref 4.0–10.5)

## 2011-10-20 LAB — GC/CHLAMYDIA PROBE AMP, GENITAL: Chlamydia, DNA Probe: NEGATIVE

## 2011-10-20 MED ORDER — OXYCODONE-ACETAMINOPHEN 5-325 MG PO TABS
1.0000 | ORAL_TABLET | Freq: Four times a day (QID) | ORAL | Status: DC | PRN
  Administered 2011-10-20: 2 via ORAL
  Filled 2011-10-20: qty 2

## 2011-10-20 MED ORDER — INFLUENZA VIRUS VACC SPLIT PF IM SUSP
0.5000 mL | INTRAMUSCULAR | Status: DC
  Filled 2011-10-20: qty 0.5

## 2011-10-20 MED ORDER — METOCLOPRAMIDE HCL 10 MG PO TABS: 10.0000 mg | ORAL_TABLET | Freq: Three times a day (TID) | ORAL | Status: DC

## 2011-10-20 MED FILL — Heparin Sodium (Porcine) Inj 5000 Unit/ML: INTRAMUSCULAR | Qty: 1 | Status: AC

## 2011-10-20 NOTE — Discharge Summary (Signed)
Physician Discharge Summary  Patient ID: Jessica Cowan MRN: [id number] DOB/AGE: 03-25-1976 35 y.o.  Admit date: 10/19/2011 Discharge date: 10/20/2011  Admission Diagnoses:  Discharge Diagnoses:  Active Problems:  * No active hospital problems. *    Discharged Condition: good  Hospital Course: Patient was taken to the operating room on the afternoon of March 7 as a result of her acute abdomen. Patient had ultrasound in the emergency room and stated that she had bilateral ovarian cysts suspicious for dermoid. Patient underwent a diagnostic laparoscopy with bilateral ovarian cystectomy and de-torsion of right adnexa. Right tube and ovary were able to be salvaged. Good perfusion after de-torsion. Small endometriotic implant was noted the left uterosacral ligament. Patient did well intraoperatively as well as postoperatively. She was kept in the hospital overnight for pain management. Her Foley catheter was discontinued early in the morning she was voiding spontaneously her vital signs were stable and her diet was advanced and she was ready to be discharged home the next morning. Pathology report pending at time of this dictation.  Consults: None  Significant Diagnostic Studies: labs: Hemoglobin 11.9 hematocrit 37.4  Treatments: surgery: Diagnostic laparoscopy bilateral ovarian cystectomy for dermoid cyst and right adnexal de-torsion   Discharge Exam: Blood pressure 113/70, pulse 75, temperature 98.8 F (37.1 C), temperature source Oral, resp. rate 18, height 5' 7.5" (1.715 m), weight 164 lb (74.39 kg), last menstrual period 10/11/2011, SpO2 100.00%. General appearance: alert and cooperative Resp: clear to auscultation bilaterally GI: soft, non-tender; bowel sounds normal; no masses,  no organomegaly Extremities: extremities normal, atraumatic, no cyanosis or edema Incision ports intact  Disposition: Home-Health Care Svc  Discharge Orders    Future Appointments: Provider: Department:  Dept Phone: Center:   11/03/2011 10:00 AM Ok Edwards, MD [facility] [phone] [facility]     Future Orders Please Complete By Expires   Resume previous diet      Driving Restrictions      Comments:   No driving for one week   Lifting restrictions      Comments:   No lifting for 2 weeks   Call MD for:  temperature >100.5      Call MD for:  redness, tenderness, or signs of infection (pain, swelling, bleeding, redness, odor or green/yellow discharge around incision site)      Call MD for:  severe or increased pain, loss or decreased feeling  in affected limb(s)      Discharge instructions      Comments:   General Postsurgical Instructions, Adult  You may expect to feel dizzy, weak, and drowsy for as long as 24 hours after receiving the medicine that made you sleep (anesthetic). The following information pertains to your recovery period for the first 24 hours following surgery. Do not drive a car, ride a bicycle, participate in physical activities, or take public transportation until you are done taking narcotic pain medicines or as directed by your caregiver.  Do not drink alcohol or take tranquilizers.  Do not take medicine that has not been prescribed by your caregiver.  Do not sign important papers or make important decisions while on narcotic pain medicines.  Have a responsible person with you.  CARE OF INCISION Change bandages (dressings) as directed.  Take showers instead of baths until your caregiver gives you permission to take baths. Check with your caregiver if you have tubes coming from the wound site.  Avoid heavy lifting (more than 10 pounds/4.5 kilograms), pushing, or pulling.  Avoid activities  that may risk injury to your surgical site.  Only take over-the-counter or prescription medicines for pain, discomfort, or fever as directed by your caregiver. Do not take aspirin. It can make you bleed. Take medicines (antibiotics) that kill germs as directed.  SEEK  MEDICAL CARE IF: You feel sick to your stomach (nauseous).  You start to throw up (vomit).  You have trouble eating or drinking.  You have an oral temperature above 100.  You have constipation that is not helped by adjusting diet or increasing fluid intake. Pain medicines are a common cause of constipation.  SEEK IMMEDIATE MEDICAL CARE IF: You have persistent dizziness.  You have difficulty breathing or a congested sounding (croupy) cough.  You have an oral temperature above 100, not controlled by medicine.  There is increasing pain or tenderness near or in the surgical site.  Document Released: 11/12/2000 Document Re-Released: 10/25/2009 [facility] Patient Information 2011 [place], [place].   [facility] HMD8:00 AMTD@ Postop visit with Dr. Lily Peer in 2 weeks March 22 at 10 AM     Medication List  As of 10/20/2011  8:01 AM   STOP taking these medications         acetaminophen 500 MG tablet      oxyCODONE-acetaminophen 5-325 MG per tablet      SEASONIQUE PO         TAKE these medications         metoCLOPramide 10 MG tablet   Commonly known as: REGLAN   Take 1 tablet (10 mg total) by mouth 3 (three) times daily with meals.             SignedOk Edwards 10/20/2011, 8:01 AM

## 2011-10-20 NOTE — Progress Notes (Signed)
Pt d/c home with family via wc, to private car. D/C instructions and prescriptions reviewed with pt. Pt verbalized understanding.  

## 2011-10-23 ENCOUNTER — Encounter: Payer: Self-pay | Admitting: Women's Health

## 2011-10-23 ENCOUNTER — Encounter (HOSPITAL_COMMUNITY): Payer: Self-pay | Admitting: Gynecology

## 2011-10-26 ENCOUNTER — Telehealth: Payer: Self-pay

## 2011-10-26 NOTE — Telephone Encounter (Signed)
Patient called asking how is she supposed to know now when she is going to feel like going back to work. She does not feel like it today and doesn't know that she will be able to on Monday.  I asked her when her preop visit with Dr. Glenetta Hew is and that is 11/03/11.  I suggested she just rest until then and let him clear her for return to work at that time.  She said she was wondering if that was okay and she is more comfortable with that than pushing to return to work. Her job is aware she will be out a couple of weeks already she said.  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  I will prepare those and mail them to patient at her request.

## 2011-11-03 ENCOUNTER — Encounter: Payer: Self-pay | Admitting: Gynecology

## 2011-11-03 ENCOUNTER — Ambulatory Visit (INDEPENDENT_AMBULATORY_CARE_PROVIDER_SITE_OTHER): Payer: BC Managed Care – PPO | Admitting: Gynecology

## 2011-11-03 VITALS — BP 110/72

## 2011-11-03 DIAGNOSIS — N898 Other specified noninflammatory disorders of vagina: Secondary | ICD-10-CM

## 2011-11-03 DIAGNOSIS — D369 Benign neoplasm, unspecified site: Secondary | ICD-10-CM

## 2011-11-03 DIAGNOSIS — Z9889 Other specified postprocedural states: Secondary | ICD-10-CM

## 2011-11-03 LAB — WET PREP FOR TRICH, YEAST, CLUE: Clue Cells Wet Prep HPF POC: NONE SEEN

## 2011-11-03 MED ORDER — CLINDAMYCIN PHOSPHATE 2 % VA CREA: 1.0000 | TOPICAL_CREAM | Freq: Every day | VAGINAL | Status: AC

## 2011-11-03 MED ORDER — LEVONORGEST-ETH ESTRAD 91-DAY 0.15-0.03 MG PO TABS: 1.0000 | ORAL_TABLET | Freq: Every day | ORAL | Status: DC

## 2011-11-03 NOTE — Progress Notes (Addendum)
Patient presented to the office today for her first postop visit. Patient status post laparoscopic bilateral ovarian cystectomy and detorsion of right adnexa for dermoid cyst.  Pathology report as follows:  FINAL DIAGNOSIS Diagnosis 1. Ovary, cyst, right - MATURE CYSTIC TERATOMA. - NO IMMATURE OR MALIGNANT FEATURES. 2. Ovary, cyst, left - MATURE CYSTIC TERATOMA. - NO IMMATURE OR MALIGNANT FEATURES  Patient is doing well with the exception of slight vaginal discharge with odor.  Exam: Abdomen port sites intact abdomen soft nontender no rebound or guarding Pelvic: Bartholin urethra Skene was within normal limits Vagina: White creamy discharge was noted Cervix: No lesions or discharge Uterus: Anteverted normal size shape and consistency Adnexa: No palpable masses or tenderness Rectal: Not examined  Wet prep demonstrated too numerous to count bacteria.  Assessment/plan: Patient status post laparoscopic bilateral ovarian cystectomy for dermoid cyst. Doing well 2 weeks postop. Bacterial vaginosis evidence and we'll treat her with Cleocin vaginal cream to apply each bedtime for 5 nights. She'll return back to the office in 4 weeks for final postop visit. Patient has continued onher  seasonique generic oral contraceptive pill. She is contemplating on getting pregnant at the end of the year.

## 2011-11-04 LAB — GC/CHLAMYDIA PROBE AMP, GENITAL
Chlamydia, DNA Probe: NEGATIVE
GC Probe Amp, Genital: NEGATIVE

## 2011-11-08 ENCOUNTER — Telehealth: Payer: Self-pay | Admitting: *Deleted

## 2011-11-08 MED ORDER — TRAMADOL HCL 50 MG PO TABS: ORAL_TABLET | ORAL | Status: DC

## 2011-11-08 MED ORDER — TRANEXAMIC ACID 650 MG PO TABS: ORAL_TABLET | ORAL | Status: DC

## 2011-11-08 NOTE — Telephone Encounter (Signed)
Pt is post laparoscopic bilateral ovarian cystectomy and detorsion of right adnexa for dermoid cyst on 10/19/11. Pt said that her period started today and she is having bad cramps. Sh e took Midol and no relief, pt is leaving work now to go home due to cramping. She is going to use a heating pad, but would like something else to relieve if possible. Please advise

## 2011-11-08 NOTE — Telephone Encounter (Signed)
Left the below message on pt vm, rx sent to pharmacy, told pt to call if questions.

## 2011-11-08 NOTE — Telephone Encounter (Signed)
Please call in Ultram 50 mg one by mouth every 4-6 hours when necessary #30 refill x3. Also to help control the amount of bleeding she can also take the Lysteda 650 mg 2 tablets 3 times a day for 5 days during her cycle. You can prescribe 30 tablets with 10 refills.

## 2011-12-01 ENCOUNTER — Encounter: Payer: Self-pay | Admitting: Gynecology

## 2011-12-01 ENCOUNTER — Ambulatory Visit (INDEPENDENT_AMBULATORY_CARE_PROVIDER_SITE_OTHER): Payer: BC Managed Care – PPO | Admitting: Gynecology

## 2011-12-01 DIAGNOSIS — Z9889 Other specified postprocedural states: Secondary | ICD-10-CM

## 2011-12-01 DIAGNOSIS — D369 Benign neoplasm, unspecified site: Secondary | ICD-10-CM

## 2011-12-01 NOTE — Patient Instructions (Addendum)
We'll see her in 6 months for her annual exam as well as a followup ultrasound. He may resume all normal activity.

## 2011-12-01 NOTE — Progress Notes (Signed)
Patient presented to the office today for 6 weeks postop visit she is status post bilateral laparoscopic cystectomy for bilateral dermoid cysts and de-torsion of right adnexa on 10/19/2011. Findings during surgery as follows:  Torsion of right adnexa was noted which was decreasing the blood flow to that ovary and had a necrotic appearance. Large 9 cm right ovarian cyst and 7-8 cm left ovarian cyst. Contralateral fallopian tube normal. Small endometriotic implant left uterosacral ligament. Normal-appearing appendix.  Final pathology report as follows:   Diagnosis 1. Ovary, cyst, right - MATURE CYSTIC TERATOMA. - NO IMMATURE OR MALIGNANT FEATURES. 2. Ovary, cyst, left - MATURE CYSTIC TERATOMA. - NO IMMATURE OR MALIGNANT FEATURES  Patient is doing well is asymptomatic and is resuming normal activity..  Exam: Abdomen: Soft nontender no rebound or guarding port sites completely healed Pelvic: Bartholin urethra Skene glands within normal limits Vagina: No lesion discharge Cervix: No lesions or discharge Uterus: Anteverted normal size shape and consistency Adnexa no palpable masses or tenderness Rectal exam was not done  Assessment/plan: Patient status post bilateral laparoscopic ovarian cystectomy for dermoid cyst and torsion of right adnexa doing well asymptomatic today. Patient may resume full normal activity which she has already. She will return back in 6 months for her annual exam and a followup ultrasound. She will continue oral contraceptive pill.

## 2012-05-21 ENCOUNTER — Encounter: Payer: Self-pay | Admitting: Gynecology

## 2012-05-21 ENCOUNTER — Ambulatory Visit (INDEPENDENT_AMBULATORY_CARE_PROVIDER_SITE_OTHER): Payer: BC Managed Care – PPO | Admitting: Gynecology

## 2012-05-21 VITALS — BP 118/72

## 2012-05-21 DIAGNOSIS — Z3490 Encounter for supervision of normal pregnancy, unspecified, unspecified trimester: Secondary | ICD-10-CM

## 2012-05-21 DIAGNOSIS — N912 Amenorrhea, unspecified: Secondary | ICD-10-CM

## 2012-05-21 DIAGNOSIS — O200 Threatened abortion: Secondary | ICD-10-CM

## 2012-05-21 DIAGNOSIS — Z331 Pregnant state, incidental: Secondary | ICD-10-CM

## 2012-05-21 LAB — PREGNANCY, URINE: Preg Test, Ur: POSITIVE

## 2012-05-21 NOTE — Patient Instructions (Addendum)
Pregnancy If you are planning on getting pregnant, it is a good idea to make a preconception appointment with your caregiver to discuss having a healthy lifestyle before getting pregnant. This includes diet, weight, exercise, taking prenatal vitamins (especially folic acid, which helps prevent brain and spinal cord defects), avoiding alcohol, smoking and illegal drugs, medical problems (diabetes, convulsions), family history of genetic problems, working conditions, and immunizations. It is better to have knowledge of these things and do something about them before getting pregnant. During your pregnancy, it is important to follow certain guidelines in order to have a healthy baby. It is very important to get good prenatal care and follow your caregiver's instructions. Prenatal care includes all the medical care you receive before your baby's birth. This helps to prevent problems during the pregnancy and childbirth. HOME CARE INSTRUCTIONS   Start your prenatal visits by the 12th week of pregnancy or earlier, if possible. At first, appointments are usually scheduled monthly. They become more frequent in the last 2 months before delivery. It is important that you keep your caregiver's appointments and follow your caregiver's instructions regarding medication use, exercise, and diet.  During pregnancy, you are providing food for you and your baby. Eat a regular, well-balanced diet. Choose foods such as meat, fish, milk and other dairy products, vegetables, fruits, whole-grain breads and cereals. Your caregiver will inform you of the ideal weight gain depending on your current height and weight. Drink lots of liquids. Try to drink 8 glasses of water a day.  Alcohol is associated with a number of birth defects including fetal alcohol syndrome. It is best to avoid alcohol completely. Smoking will cause low birth rate and prematurity. Use of alcohol and nicotine during your pregnancy also increases the chances that  your child will be chemically dependent later in their life and may contribute to SIDS (Sudden Infant Death Syndrome).  Do not use illegal drugs.  Only take prescription or over-the-counter medications that are recommended by your caregiver. Other medications can cause genetic and physical problems in the baby.  Morning sickness can often be helped by keeping soda crackers at the bedside. Eat a few before getting up in the morning.  A sexual relationship may be continued until near the end of pregnancy if there are no other problems such as early (premature) leaking of amniotic fluid from the membranes, vaginal bleeding, painful intercourse or belly (abdominal) pain.  Exercise regularly. Check with your caregiver if you are unsure of the safety of some of your exercises.  Do not use hot tubs, steam rooms or saunas. These increase the risk of fainting and hurting yourself and the baby. Swimming is OK for exercise. Get plenty of rest, including afternoon naps when possible, especially in the third trimester.  Avoid toxic odors and chemicals.  Do not wear high heels. They may cause you to lose your balance and fall.  Do not lift over 5 pounds. If you do lift anything, lift with your legs and thighs, not your back.  Avoid long trips, especially in the third trimester.  If you have to travel out of the city or state, take a copy of your medical records with you. SEEK IMMEDIATE MEDICAL CARE IF:   You develop an unexplained oral temperature above 102 F (38.9 C), or as your caregiver suggests.  You have leaking of fluid from the vagina. If leaking membranes are suspected, take your temperature and inform your caregiver of this when you call.  There is vaginal spotting   or bleeding. Notify your caregiver of the amount and how many pads are used.  You continue to feel sick to your stomach (nauseous) and have no relief from remedies suggested, or you throw up (vomit) blood or coffee ground like  materials.  You develop upper abdominal pain.  You have round ligament discomfort in the lower abdominal area. This still must be evaluated by your caregiver.  You feel contractions of the uterus.  You do not feel the baby move, or there is less movement than before.  You have painful urination.  You have abnormal vaginal discharge.  You have persistent diarrhea.  You get a severe headache.  You have problems with your vision.  You develop muscle weakness.  You feel dizzy and faint.  You develop shortness of breath.  You develop chest pain.  You have back pain that travels down to your leg and feet.  You feel irregular or a very fast heartbeat.  You develop excessive weight gain in a short period of time (5 pounds in 3 to 5 days).  You are involved in a domestic violence situation. Document Released: 07/31/2005 Document Revised: 01/30/2012 Document Reviewed: 01/22/2009 [facility] Patient Information 2013 [place], [place].  Threatened Miscarriage Bleeding during the first 20 weeks of pregnancy is common. This is sometimes called a threatened miscarriage. This is a pregnancy that is threatening to end before the twentieth week of pregnancy. Often this bleeding stops with bed rest or decreased activities as suggested by your caregiver and the pregnancy continues without any more problems. You may be asked to not have sexual intercourse, have orgasms or use tampons until further notice. Sometimes a threatened miscarriage can progress to a complete or incomplete miscarriage. This may or may not require further treatment. Some miscarriages occur before a woman misses a menstrual period and knows she is pregnant. Miscarriages occur in 15 to 20% of all pregnancies and usually occur during the first 13 weeks of the pregnancy. The exact cause of a miscarriage is usually never known. A miscarriage is natures way of ending a pregnancy that is abnormal or would not make it to term. There are  some things that may put you at risk to have a miscarriage, such as:  Hormone problems.  Infection of the uterus or cervix.  Chronic illness, diabetes for example, especially if it is not controlled.  Abnormal shaped uterus.  Fibroids in the uterus.  Incompetent cervix (the cervix is too weak to hold the baby).  Smoking.  Drinking too much alcohol. It's best not to drink any alcohol when you are pregnant.  Taking illegal drugs. TREATMENT  When a miscarriage becomes complete and all products of conception (all the tissue in the uterus) have been passed, often no treatment is needed. If you think you passed tissue, save it in a container and take it to your doctor for evaluation. If the miscarriage is incomplete (parts of the fetus or placenta remain in the uterus), further treatment may be needed. The most common reason for further treatment is continued bleeding (hemorrhage) because pregnancy tissue did not pass out of the uterus. This often occurs if a miscarriage is incomplete. Tissue left behind may also become infected. Treatment usually is dilatation and curettage (the removal of the remaining products of pregnancy. This can be done by a simple sucking procedure (suction curettage) or a simple scraping of the inside of the uterus. This may be done in the hospital or in the caregiver's office. This is only done when  your caregiver knows that there is no chance for the pregnancy to proceed to term. This is determined by physical examination, negative pregnancy test, falling pregnancy hormone count and/or, an ultrasound revealing a dead fetus. Miscarriages are often a very emotional time for prospective mothers and fathers. This is not you or your partners fault. It did not occur because of an inadequacy in you or your partner. Nearly all miscarriages occur because the pregnancy has started off wrongly. At least half of these pregnancies have a chromosomal abnormality. It is almost always not  inherited. Others may have developmental problems with the fetus or placenta. This does not always show up even when the products miscarried are studied under the microscope. The miscarriage is nearly always not your fault and it is not likely that you could have prevented it from happening. If you are having emotional and grieving problems, talk to your health care provider and even seek counseling, if necessary, before getting pregnant again. You can begin trying for another pregnancy as soon as your caregiver says it is OK. HOME CARE INSTRUCTIONS   Your caregiver may order bed rest depending on how much bleeding and cramping you are having. You may be limited to only getting up to go to the bathroom. You may be allowed to continue light activity. You may need to make arrangements for the care of your other children and for any other responsibilities.  Keep track of the number of pads you use each day, how often you have to change pads and how saturated (soaked) they are. Record this information.  DO NOT USE TAMPONS. Do not douche, have sexual intercourse or orgasms until approved by your caregiver.  You may receive a follow up appointment for re-evaluation of your pregnancy and a repeat blood test. Re-evaluation often occurs after 2 days and again in 4 to 6 weeks. It is very important that you follow-up in the recommended time period.  If you are Rh negative and the father is Rh positive or you do not know the fathers' blood type, you may receive a shot (Rh immune globulin) to help prevent abnormal antibodies that can develop and affect the baby in any future pregnancies. SEEK IMMEDIATE MEDICAL CARE IF:  You have severe cramps in your stomach, back, or abdomen.  You have a sudden onset of severe pain in the lower part of your abdomen.  You develop chills.  You run an unexplained temperature of 101 F (38.3 C) or higher.  You pass large clots or tissue. Save any tissue for your caregiver to  inspect.  Your bleeding increases or you become light-headed, weak, or have fainting episodes.  You have a gush of fluid from your vagina.  You pass out. This could mean you have a tubal (ectopic) pregnancy. Document Released: 07/31/2005 Document Revised: 10/23/2011 Document Reviewed: 03/16/2008 [facility] Patient Information 2013 [place], [place].

## 2012-05-21 NOTE — Progress Notes (Signed)
Patient is a 36 year old now gravida 2 para 1 who stated that she was using the rhythm method for contraception since she discontinued the oral contraceptive pills in June. 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 She was having some slight nausea but no vomiting some breast tenderness and brownish vaginal discharge. Patient stated her last normal menstrual period was 04/15/2012.  Exam: Abdomen: Soft nontender no rebound or guarding Pelvic: Bartholin urethra Skene glands within normal limits Vagina: Brown discharge noted in the vagina such as and old blood Cervix: No lesions or discharge Uterus: Anteverted 4-6 weeks size nontender Adnexa: No palpable masses or tenderness Rectal exam: Not done  Office urine pregnancy test positive  Assessment/plan: First trimester early pregnancy based on last menstrual period she is currently 5-2/[redacted] week gestation with an estimated date of confinement 01/21/2013. Patient will have a quantitative beta-hCG today we'll repeat a quantitative beta-hCG on Monday, October 14 and with a followup ultrasound on October 22. If her quantitative beta-hCG is above 1500 mL international units per mL or greater will move up her ultrasound to an earlier date to confirm intrauterine pregnancy because of her prior history of laparoscopic bilateral ovarian cystectomy. Patient will be started on prenatal vitamins. Threatened AB precautions were discussed. All questions were answered and we will follow accordingly.

## 2012-05-27 ENCOUNTER — Other Ambulatory Visit: Payer: BC Managed Care – PPO

## 2012-05-27 DIAGNOSIS — Z3490 Encounter for supervision of normal pregnancy, unspecified, unspecified trimester: Secondary | ICD-10-CM

## 2012-05-27 DIAGNOSIS — N912 Amenorrhea, unspecified: Secondary | ICD-10-CM

## 2012-05-28 ENCOUNTER — Telehealth: Payer: Self-pay | Admitting: *Deleted

## 2012-05-28 NOTE — Telephone Encounter (Signed)
Pt called to get you advice about her brown discharge. Pt is pregnant, last OV 05/21/12. She has brownish dark blood or discharge in her panty liner. Pt had this on exam date on 05/21/12 as well, she said that this am it was more than on OV date. She has a little back pain last night no bleeding no cramping, no pain. No bright red blood only just dark like blood. She is very nervous if this is okay.

## 2012-05-28 NOTE — Telephone Encounter (Signed)
Okay, pt has ultrasound appointment scheduled on 06/03/12, would you like her to reschedule this appointment? And just come tomorrow?

## 2012-05-28 NOTE — Telephone Encounter (Signed)
Left message on voicemail with the below and to make ultrasound appointment.

## 2012-05-28 NOTE — Telephone Encounter (Signed)
Please inform patient that early in pregnancy from the implantation site he may get a little bit of bleeding which makes this with the cervical mucus and gives you that brownish discharge. Her quantitative beta-hCGs have been rising nicely. I would like to see her tomorrow with an ultrasound. Please schedule ultrasound appointment and to see me tomorrow.

## 2012-05-28 NOTE — Telephone Encounter (Signed)
Please schedule ultrasound for tomorrow you can go ahead and and cancel October 21. Thank you

## 2012-05-29 ENCOUNTER — Ambulatory Visit (INDEPENDENT_AMBULATORY_CARE_PROVIDER_SITE_OTHER): Payer: BC Managed Care – PPO

## 2012-05-29 ENCOUNTER — Encounter: Payer: Self-pay | Admitting: Gynecology

## 2012-05-29 ENCOUNTER — Ambulatory Visit (INDEPENDENT_AMBULATORY_CARE_PROVIDER_SITE_OTHER): Payer: BC Managed Care – PPO | Admitting: Gynecology

## 2012-05-29 VITALS — BP 122/78

## 2012-05-29 DIAGNOSIS — O2690 Pregnancy related conditions, unspecified, unspecified trimester: Secondary | ICD-10-CM

## 2012-05-29 DIAGNOSIS — N912 Amenorrhea, unspecified: Secondary | ICD-10-CM

## 2012-05-29 DIAGNOSIS — Z331 Pregnant state, incidental: Secondary | ICD-10-CM

## 2012-05-29 DIAGNOSIS — Z3490 Encounter for supervision of normal pregnancy, unspecified, unspecified trimester: Secondary | ICD-10-CM

## 2012-05-29 DIAGNOSIS — O200 Threatened abortion: Secondary | ICD-10-CM

## 2012-05-29 LAB — US OB TRANSVAGINAL

## 2012-05-29 MED ORDER — METOCLOPRAMIDE HCL 10 MG PO TABS: 10.0000 mg | ORAL_TABLET | Freq: Three times a day (TID) | ORAL | Status: DC

## 2012-05-29 NOTE — Patient Instructions (Addendum)
Mylanta, Maalox, Tylenol, Robitussin are all safe.   Jessica Cowan

## 2012-05-29 NOTE — Progress Notes (Signed)
Patient is a 36 year old now gravida 2 para 1 who stated that she was using the rhythm method for contraception since she discontinued the oral contraceptive pills in June. 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 She was having some slight nausea but no vomiting some breast tenderness and brownish vaginal discharge. Patient stated her last normal menstrual period was 04/15/2012.   Exam:  Abdomen: Soft nontender no rebound or guarding  Pelvic: Bartholin urethra Skene glands within normal limits  Vagina: Brown discharge noted in the vagina such as and old blood  Cervix: No lesions or discharge  Uterus: Anteverted 4-6 weeks size nontender  Adnexa: No palpable masses or tenderness  Rectal exam: Not done  Patient had several quantitative beta-hCGs prior to today's ultrasound as follows:  Results for SHERLIN, SICARI (MRN [id number]) as of 05/29/2012 15:36  Ref. Range 05/21/2012 17:02 05/27/2012 14:45  hCG, Beta Chain, Quant, S No range found 20447.4 52771.8   She has some slight brownish discharge today no recent intercourse. Ultrasound today demonstrated the following: Living intrauterine pregnancy was seen in the fundus. Size consistent with her dates. Currently 6 weeks and 2 days by last menstrual period in 6 weeks and 3 days by ultrasound with a due date of 01/19/2013. Fetal pole and cardiac activity was seen. Noted in the right uterine fundus with a cystic solid area measuring 23 x 19 x 9 mm this year was totally separate from the gestational sac. Questionable subchorionic hematoma versus vanishing twin gestation? Right ovary was normal and a small left corpus luteum cyst was seen cervix is long closed.  Assessment/plan: First trimester pregnancy at 6 weeks and 2 days with an estimated date of confinement 01/20/2013. Threatened AB precautions discussed with the patient. She was given a  prescription of Reglan 10 mg to take 1 by mouth every 6 hours when necessary nausea. She will continue her prenatal vitamins. Were going to refer her to her obstetrical colleagues at [facility] for her obstetrical care.

## 2012-06-03 ENCOUNTER — Ambulatory Visit: Payer: BC Managed Care – PPO | Admitting: Gynecology

## 2012-06-03 ENCOUNTER — Other Ambulatory Visit: Payer: BC Managed Care – PPO

## 2012-06-17 LAB — OB RESULTS CONSOLE HEPATITIS B SURFACE ANTIGEN: Hepatitis B Surface Ag: NEGATIVE

## 2013-01-20 ENCOUNTER — Inpatient Hospital Stay (HOSPITAL_COMMUNITY): Admission: AD | Admit: 2013-01-20 | Payer: Self-pay | Source: Ambulatory Visit | Admitting: Obstetrics and Gynecology

## 2013-01-21 ENCOUNTER — Encounter (HOSPITAL_COMMUNITY): Payer: Self-pay | Admitting: *Deleted

## 2013-01-21 ENCOUNTER — Telehealth (HOSPITAL_COMMUNITY): Payer: Self-pay | Admitting: *Deleted

## 2013-01-21 NOTE — Telephone Encounter (Signed)
Preadmission screen  

## 2013-01-23 ENCOUNTER — Inpatient Hospital Stay (HOSPITAL_COMMUNITY)
Admission: RE | Admit: 2013-01-23 | Discharge: 2013-01-25 | DRG: 374 | Disposition: A | Discharge status: Home / Self Care | Payer: BC Managed Care – PPO | Source: Ambulatory Visit | Attending: Obstetrics and Gynecology | Admitting: Obstetrics and Gynecology

## 2013-01-23 ENCOUNTER — Encounter (HOSPITAL_COMMUNITY): Payer: Self-pay

## 2013-01-23 DIAGNOSIS — O99892 Other specified diseases and conditions complicating childbirth: Secondary | ICD-10-CM | POA: Diagnosis present

## 2013-01-23 DIAGNOSIS — O09529 Supervision of elderly multigravida, unspecified trimester: Secondary | ICD-10-CM | POA: Diagnosis present

## 2013-01-23 DIAGNOSIS — Z22330 Carrier of Group B streptococcus: Secondary | ICD-10-CM

## 2013-01-23 DIAGNOSIS — Z302 Encounter for sterilization: Secondary | ICD-10-CM

## 2013-01-23 LAB — CBC
Hemoglobin: 11.9 g/dL — ABNORMAL LOW (ref 12.0–15.0)
MCH: 30.9 pg (ref 26.0–34.0)
MCHC: 32.9 g/dL (ref 30.0–36.0)
RDW: 14 % (ref 11.5–15.5)

## 2013-01-23 MED ORDER — PENICILLIN G POTASSIUM 5000000 UNITS IJ SOLR
5.0000 10*6.[IU] | Freq: Once | INTRAVENOUS | Status: DC
  Filled 2013-01-23: qty 5

## 2013-01-23 MED ORDER — PENICILLIN G POTASSIUM 5000000 UNITS IJ SOLR
5.0000 10*6.[IU] | Freq: Once | INTRAVENOUS | Status: AC
  Administered 2013-01-24: 5 10*6.[IU] via INTRAVENOUS
  Filled 2013-01-23: qty 5

## 2013-01-23 MED ORDER — ZOLPIDEM TARTRATE 5 MG PO TABS: 5.0000 mg | ORAL_TABLET | Freq: Every evening | ORAL | Status: DC | PRN

## 2013-01-23 MED ORDER — ONDANSETRON HCL 4 MG/2ML IJ SOLN: 4.0000 mg | Freq: Four times a day (QID) | INTRAMUSCULAR | Status: DC | PRN

## 2013-01-23 MED ORDER — ACETAMINOPHEN 325 MG PO TABS: 650.0000 mg | ORAL_TABLET | ORAL | Status: DC | PRN

## 2013-01-23 MED ORDER — LIDOCAINE HCL (PF) 1 % IJ SOLN
30.0000 mL | INTRAMUSCULAR | Status: DC | PRN
  Filled 2013-01-23: qty 30

## 2013-01-23 MED ORDER — LACTATED RINGERS IV SOLN
500.0000 mL | INTRAVENOUS | Status: DC | PRN
  Administered 2013-01-23: 500 mL via INTRAVENOUS

## 2013-01-23 MED ORDER — MISOPROSTOL 25 MCG QUARTER TABLET
25.0000 ug | ORAL_TABLET | ORAL | Status: DC | PRN
  Administered 2013-01-23: 25 ug via VAGINAL
  Filled 2013-01-23: qty 0.25
  Filled 2013-01-23: qty 1

## 2013-01-23 MED ORDER — TERBUTALINE SULFATE 1 MG/ML IJ SOLN
0.2500 mg | Freq: Once | INTRAMUSCULAR | Status: AC | PRN
  Filled 2013-01-23: qty 1

## 2013-01-23 MED ORDER — OXYTOCIN BOLUS FROM INFUSION: 500.0000 mL | INTRAVENOUS | Status: DC

## 2013-01-23 MED ORDER — LACTATED RINGERS IV SOLN
INTRAVENOUS | Status: DC
  Administered 2013-01-24 (×2): via INTRAVENOUS

## 2013-01-23 MED ORDER — IBUPROFEN 600 MG PO TABS
600.0000 mg | ORAL_TABLET | Freq: Four times a day (QID) | ORAL | Status: DC | PRN
  Administered 2013-01-24: 600 mg via ORAL
  Filled 2013-01-23: qty 1

## 2013-01-23 MED ORDER — OXYTOCIN 40 UNITS IN LACTATED RINGERS INFUSION - SIMPLE MED
62.5000 mL/h | INTRAVENOUS | Status: DC
  Administered 2013-01-24: 62.5 mL/h via INTRAVENOUS

## 2013-01-23 MED ORDER — CITRIC ACID-SODIUM CITRATE 334-500 MG/5ML PO SOLN: 30.0000 mL | ORAL | Status: DC | PRN

## 2013-01-23 MED ORDER — OXYCODONE-ACETAMINOPHEN 5-325 MG PO TABS: 1.0000 | ORAL_TABLET | ORAL | Status: DC | PRN

## 2013-01-23 MED ORDER — OXYTOCIN 40 UNITS IN LACTATED RINGERS INFUSION - SIMPLE MED
1.0000 m[IU]/min | INTRAVENOUS | Status: DC
  Administered 2013-01-24: 2 m[IU]/min via INTRAVENOUS
  Filled 2013-01-23: qty 1000

## 2013-01-23 MED ORDER — PENICILLIN G POTASSIUM 5000000 UNITS IJ SOLR
2.5000 10*6.[IU] | INTRAVENOUS | Status: DC
  Filled 2013-01-23 (×2): qty 2.5

## 2013-01-23 MED ORDER — PENICILLIN G POTASSIUM 5000000 UNITS IJ SOLR
2.5000 10*6.[IU] | INTRAMUSCULAR | Status: DC
  Filled 2013-01-23 (×2): qty 2.5

## 2013-01-23 NOTE — H&P (Signed)
Jessica Cowan is a 37 y.o. female presenting for IOL. History OB History   Grav Para Term Preterm Abortions TAB SAB Ect Mult Living   2 1 1       1      Past Medical History  Diagnosis Date  . AMA (advanced maternal age) multigravida 35+   . Hx of varicella   . Mature cystic teratoma    Past Surgical History  Procedure Laterality Date  . Laparoscopy  10/19/2011    Procedure: LAPAROSCOPY OPERATIVE;  Surgeon: Ok Edwards, MD;  Location: [facility];  Service: Gynecology;  Laterality: N/A;  Bilateral Ovarian Cystectomy, Detorsion Right Adnexa  . Bilateral ovarian cystectomy     Family History: family history includes Cancer in her other, paternal grandfather, and paternal grandmother; Diabetes in her father and other; Hyperlipidemia in her other; and Hypertension in her father and other. Social History:  reports that she has never smoked. She has never used smokeless tobacco. She reports that  drinks alcohol. She reports that she does not use illicit drugs.   Prenatal Transfer Tool  Maternal Diabetes: No Genetic Screening: Declined Maternal Ultrasounds/Referrals: Normal Fetal Ultrasounds or other Referrals:  None Maternal Substance Abuse:  No Significant Maternal Medications:  None Significant Maternal Lab Results:  None Other Comments:  None  ROS  Dilation: 1.5 Effacement (%): 50 Station: -2 Exam by:: A.Davis,RN Blood pressure 129/75, pulse 81, temperature 98 F (36.7 C), temperature source Oral, resp. rate 18, height 5\' 7"  (1.702 m), weight 89.812 kg (198 lb), last menstrual period 04/15/2012. Exam Physical Exam  Prenatal labs: ABO, Rh: A/Positive/-- (11/04 0000) Antibody: Negative (11/04 0000) Rubella: Immune (11/04 0000) RPR: Nonreactive (11/04 0000)  HBsAg: Negative (11/04 0000)  HIV: Non-reactive (11/04 0000)  GBS: Positive (05/19 0000)   Assessment/Plan: Admit cytotec iol   Jessica Cowan 01/23/2013, 8:36 PM

## 2013-01-24 ENCOUNTER — Encounter (HOSPITAL_COMMUNITY): Payer: Self-pay

## 2013-01-24 ENCOUNTER — Inpatient Hospital Stay (HOSPITAL_COMMUNITY): Payer: BC Managed Care – PPO | Admitting: Anesthesiology

## 2013-01-24 ENCOUNTER — Encounter (HOSPITAL_COMMUNITY): Payer: Self-pay | Admitting: Anesthesiology

## 2013-01-24 ENCOUNTER — Encounter (HOSPITAL_COMMUNITY)
Admission: RE | Disposition: A | Discharge status: Home / Self Care | Payer: Self-pay | Source: Ambulatory Visit | Attending: Obstetrics and Gynecology

## 2013-01-24 HISTORY — PX: TUBAL LIGATION: SHX77

## 2013-01-24 LAB — RPR: RPR Ser Ql: NONREACTIVE

## 2013-01-24 SURGERY — LIGATION, FALLOPIAN TUBE, POSTPARTUM
Anesthesia: Epidural | Site: Abdomen | Laterality: Bilateral | Wound class: Clean Contaminated

## 2013-01-24 MED ORDER — WITCH HAZEL-GLYCERIN EX PADS: 1.0000 "application " | MEDICATED_PAD | CUTANEOUS | Status: DC | PRN

## 2013-01-24 MED ORDER — PHENYLEPHRINE 40 MCG/ML (10ML) SYRINGE FOR IV PUSH (FOR BLOOD PRESSURE SUPPORT)
80.0000 ug | PREFILLED_SYRINGE | INTRAVENOUS | Status: DC | PRN
  Filled 2013-01-24 (×2): qty 5
  Filled 2013-01-24: qty 2

## 2013-01-24 MED ORDER — OXYCODONE-ACETAMINOPHEN 5-325 MG PO TABS
1.0000 | ORAL_TABLET | ORAL | Status: DC | PRN
  Administered 2013-01-24 – 2013-01-25 (×2): 2 via ORAL
  Filled 2013-01-24 (×2): qty 2

## 2013-01-24 MED ORDER — DIBUCAINE 1 % RE OINT: 1.0000 "application " | TOPICAL_OINTMENT | RECTAL | Status: DC | PRN

## 2013-01-24 MED ORDER — KETOROLAC TROMETHAMINE 30 MG/ML IJ SOLN
INTRAMUSCULAR | Status: DC | PRN
  Administered 2013-01-24: 30 mg via INTRAVENOUS

## 2013-01-24 MED ORDER — FENTANYL CITRATE 0.05 MG/ML IJ SOLN: 25.0000 ug | INTRAMUSCULAR | Status: DC | PRN

## 2013-01-24 MED ORDER — MIDAZOLAM HCL 5 MG/5ML IJ SOLN
INTRAMUSCULAR | Status: DC | PRN
  Administered 2013-01-24: 1 mg via INTRAVENOUS

## 2013-01-24 MED ORDER — SODIUM BICARBONATE 8.4 % IV SOLN
INTRAVENOUS | Status: DC | PRN
  Administered 2013-01-24: 5 mL via EPIDURAL

## 2013-01-24 MED ORDER — ZOLPIDEM TARTRATE 5 MG PO TABS: 5.0000 mg | ORAL_TABLET | Freq: Every evening | ORAL | Status: DC | PRN

## 2013-01-24 MED ORDER — SENNOSIDES-DOCUSATE SODIUM 8.6-50 MG PO TABS
2.0000 | ORAL_TABLET | Freq: Every day | ORAL | Status: DC
  Administered 2013-01-24: 2 via ORAL

## 2013-01-24 MED ORDER — PROMETHAZINE HCL 25 MG/ML IJ SOLN: 6.2500 mg | INTRAMUSCULAR | Status: DC | PRN

## 2013-01-24 MED ORDER — SODIUM BICARBONATE 8.4 % IV SOLN
INTRAVENOUS | Status: DC | PRN
  Administered 2013-01-24: 3 mL via EPIDURAL

## 2013-01-24 MED ORDER — FENTANYL CITRATE 0.05 MG/ML IJ SOLN
INTRAMUSCULAR | Status: DC | PRN
  Administered 2013-01-24: 50 ug via INTRAVENOUS

## 2013-01-24 MED ORDER — PRENATAL MULTIVITAMIN CH
1.0000 | ORAL_TABLET | Freq: Every day | ORAL | Status: DC
  Administered 2013-01-24 – 2013-01-25 (×2): 1 via ORAL
  Filled 2013-01-24 (×2): qty 1

## 2013-01-24 MED ORDER — KETOROLAC TROMETHAMINE 30 MG/ML IJ SOLN
INTRAMUSCULAR | Status: AC
  Filled 2013-01-24: qty 1

## 2013-01-24 MED ORDER — ONDANSETRON HCL 4 MG/2ML IJ SOLN: 4.0000 mg | INTRAMUSCULAR | Status: DC | PRN

## 2013-01-24 MED ORDER — PHENYLEPHRINE 40 MCG/ML (10ML) SYRINGE FOR IV PUSH (FOR BLOOD PRESSURE SUPPORT)
PREFILLED_SYRINGE | INTRAVENOUS | Status: AC
  Filled 2013-01-24: qty 5

## 2013-01-24 MED ORDER — CEFAZOLIN SODIUM-DEXTROSE 2-3 GM-% IV SOLR
INTRAVENOUS | Status: DC | PRN
  Administered 2013-01-24: 2 g via INTRAVENOUS

## 2013-01-24 MED ORDER — LACTATED RINGERS IV SOLN
500.0000 mL | Freq: Once | INTRAVENOUS | Status: AC
  Administered 2013-01-24: 500 mL via INTRAVENOUS

## 2013-01-24 MED ORDER — FENTANYL CITRATE 0.05 MG/ML IJ SOLN
INTRAMUSCULAR | Status: AC
  Filled 2013-01-24: qty 5

## 2013-01-24 MED ORDER — BENZOCAINE-MENTHOL 20-0.5 % EX AERO
1.0000 "application " | INHALATION_SPRAY | CUTANEOUS | Status: DC | PRN
  Administered 2013-01-24: 1 via TOPICAL
  Filled 2013-01-24: qty 56

## 2013-01-24 MED ORDER — ONDANSETRON HCL 4 MG PO TABS: 4.0000 mg | ORAL_TABLET | ORAL | Status: DC | PRN

## 2013-01-24 MED ORDER — SIMETHICONE 80 MG PO CHEW: 80.0000 mg | CHEWABLE_TABLET | ORAL | Status: DC | PRN

## 2013-01-24 MED ORDER — PHENYLEPHRINE HCL 10 MG/ML IJ SOLN
INTRAMUSCULAR | Status: DC | PRN
  Administered 2013-01-24 (×3): 40 ug via INTRAVENOUS

## 2013-01-24 MED ORDER — PHENYLEPHRINE 40 MCG/ML (10ML) SYRINGE FOR IV PUSH (FOR BLOOD PRESSURE SUPPORT)
80.0000 ug | PREFILLED_SYRINGE | INTRAVENOUS | Status: DC | PRN
  Filled 2013-01-24: qty 2

## 2013-01-24 MED ORDER — SODIUM BICARBONATE 8.4 % IV SOLN
INTRAVENOUS | Status: AC
  Filled 2013-01-24: qty 50

## 2013-01-24 MED ORDER — LACTATED RINGERS IV SOLN
INTRAVENOUS | Status: DC
  Administered 2013-01-24: 20 mL/h via INTRAVENOUS
  Administered 2013-01-24 (×2): via INTRAVENOUS

## 2013-01-24 MED ORDER — FAMOTIDINE 20 MG PO TABS
40.0000 mg | ORAL_TABLET | Freq: Once | ORAL | Status: AC
  Administered 2013-01-24: 40 mg via ORAL
  Filled 2013-01-24: qty 2

## 2013-01-24 MED ORDER — ONDANSETRON HCL 4 MG/2ML IJ SOLN
INTRAMUSCULAR | Status: DC | PRN
  Administered 2013-01-24: 4 mg via INTRAVENOUS

## 2013-01-24 MED ORDER — EPHEDRINE 5 MG/ML INJ
10.0000 mg | INTRAVENOUS | Status: DC | PRN
  Filled 2013-01-24: qty 2

## 2013-01-24 MED ORDER — TETANUS-DIPHTH-ACELL PERTUSSIS 5-2.5-18.5 LF-MCG/0.5 IM SUSP: 0.5000 mL | Freq: Once | INTRAMUSCULAR | Status: DC

## 2013-01-24 MED ORDER — MIDAZOLAM HCL 2 MG/2ML IJ SOLN: 0.5000 mg | Freq: Once | INTRAMUSCULAR | Status: AC | PRN

## 2013-01-24 MED ORDER — BUPIVACAINE HCL (PF) 0.25 % IJ SOLN
INTRAMUSCULAR | Status: AC
  Filled 2013-01-24: qty 30

## 2013-01-24 MED ORDER — MEPERIDINE HCL 25 MG/ML IJ SOLN: 6.2500 mg | INTRAMUSCULAR | Status: DC | PRN

## 2013-01-24 MED ORDER — MIDAZOLAM HCL 2 MG/2ML IJ SOLN
INTRAMUSCULAR | Status: AC
  Filled 2013-01-24: qty 2

## 2013-01-24 MED ORDER — BISACODYL 10 MG RE SUPP: 10.0000 mg | Freq: Every day | RECTAL | Status: DC | PRN

## 2013-01-24 MED ORDER — CEFAZOLIN SODIUM-DEXTROSE 2-3 GM-% IV SOLR
INTRAVENOUS | Status: AC
  Filled 2013-01-24: qty 50

## 2013-01-24 MED ORDER — LANOLIN HYDROUS EX OINT: TOPICAL_OINTMENT | CUTANEOUS | Status: DC | PRN

## 2013-01-24 MED ORDER — METOCLOPRAMIDE HCL 10 MG PO TABS
10.0000 mg | ORAL_TABLET | Freq: Once | ORAL | Status: AC
  Administered 2013-01-24: 10 mg via ORAL
  Filled 2013-01-24: qty 1

## 2013-01-24 MED ORDER — KETOROLAC TROMETHAMINE 30 MG/ML IJ SOLN: 15.0000 mg | Freq: Once | INTRAMUSCULAR | Status: AC | PRN

## 2013-01-24 MED ORDER — LIDOCAINE-EPINEPHRINE (PF) 2 %-1:200000 IJ SOLN
INTRAMUSCULAR | Status: AC
  Filled 2013-01-24: qty 20

## 2013-01-24 MED ORDER — BUPIVACAINE HCL (PF) 0.25 % IJ SOLN
INTRAMUSCULAR | Status: DC | PRN
  Administered 2013-01-24: 28 mL

## 2013-01-24 MED ORDER — DIPHENHYDRAMINE HCL 50 MG/ML IJ SOLN: 12.5000 mg | INTRAMUSCULAR | Status: DC | PRN

## 2013-01-24 MED ORDER — EPHEDRINE 5 MG/ML INJ
10.0000 mg | INTRAVENOUS | Status: DC | PRN
  Filled 2013-01-24: qty 2
  Filled 2013-01-24 (×2): qty 4

## 2013-01-24 MED ORDER — IBUPROFEN 600 MG PO TABS
600.0000 mg | ORAL_TABLET | Freq: Four times a day (QID) | ORAL | Status: DC
  Administered 2013-01-25 (×3): 600 mg via ORAL
  Filled 2013-01-24 (×3): qty 1

## 2013-01-24 MED ORDER — FLEET ENEMA 7-19 GM/118ML RE ENEM: 1.0000 | ENEMA | Freq: Every day | RECTAL | Status: DC | PRN

## 2013-01-24 MED ORDER — DIPHENHYDRAMINE HCL 25 MG PO CAPS: 25.0000 mg | ORAL_CAPSULE | Freq: Four times a day (QID) | ORAL | Status: DC | PRN

## 2013-01-24 MED ORDER — TERBUTALINE SULFATE 1 MG/ML IJ SOLN
0.2500 mg | Freq: Once | INTRAMUSCULAR | Status: AC
  Administered 2013-01-24: 0.25 mg via SUBCUTANEOUS

## 2013-01-24 MED ORDER — FENTANYL 2.5 MCG/ML BUPIVACAINE 1/10 % EPIDURAL INFUSION (WH - ANES)
14.0000 mL/h | INTRAMUSCULAR | Status: DC | PRN
  Administered 2013-01-24: 14 mL/h via EPIDURAL
  Filled 2013-01-24: qty 125

## 2013-01-24 SURGICAL SUPPLY — 26 items
ADH SKN CLS APL DERMABOND .7 (GAUZE/BANDAGES/DRESSINGS) ×1
APL SKNCLS STERI-STRIP NONHPOA (GAUZE/BANDAGES/DRESSINGS)
BENZOIN TINCTURE PRP APPL 2/3 (GAUZE/BANDAGES/DRESSINGS) IMPLANT
CLOTH BEACON ORANGE TIMEOUT ST (SAFETY) ×2 IMPLANT
CONTAINER PREFILL 10% NBF 15ML (MISCELLANEOUS) ×4 IMPLANT
DERMABOND ADVANCED (GAUZE/BANDAGES/DRESSINGS) ×1
DERMABOND ADVANCED .7 DNX12 (GAUZE/BANDAGES/DRESSINGS) IMPLANT
ELECT REM PT RETURN 9FT ADLT (ELECTROSURGICAL) ×2
ELECTRODE REM PT RTRN 9FT ADLT (ELECTROSURGICAL) ×1 IMPLANT
GLOVE BIO SURGEON STRL SZ8 (GLOVE) ×4 IMPLANT
GOWN PREVENTION PLUS LG XLONG (DISPOSABLE) ×2 IMPLANT
GOWN STRL REIN XL XLG (GOWN DISPOSABLE) ×2 IMPLANT
NDL HYPO 25X1 1.5 SAFETY (NEEDLE) IMPLANT
NEEDLE HYPO 25X1 1.5 SAFETY (NEEDLE) IMPLANT
NS IRRIG 1000ML POUR BTL (IV SOLUTION) ×2 IMPLANT
PACK ABDOMINAL MINOR (CUSTOM PROCEDURE TRAY) ×2 IMPLANT
PENCIL BUTTON HOLSTER BLD 10FT (ELECTRODE) ×2 IMPLANT
SPONGE LAP 4X18 X RAY DECT (DISPOSABLE) IMPLANT
STRIP CLOSURE SKIN 1/4X3 (GAUZE/BANDAGES/DRESSINGS) IMPLANT
SUT PLAIN 0 NONE (SUTURE) ×2 IMPLANT
SUT VICRYL 0 UR6 27IN ABS (SUTURE) ×2 IMPLANT
SUT VICRYL 4-0 PS2 18IN ABS (SUTURE) ×2 IMPLANT
SYR CONTROL 10ML LL (SYRINGE) IMPLANT
TOWEL OR 17X24 6PK STRL BLUE (TOWEL DISPOSABLE) ×4 IMPLANT
TRAY FOLEY CATH 14FR (SET/KITS/TRAYS/PACK) ×2 IMPLANT
WATER STERILE IRR 1000ML POUR (IV SOLUTION) ×2 IMPLANT

## 2013-01-24 NOTE — Anesthesia Postprocedure Evaluation (Signed)
  Anesthesia Post-op Note  Patient: Jessica Cowan  Procedure(s) Performed: * No procedures listed *  Patient Location: Mother/Baby  Anesthesia Type:Epidural  Level of Consciousness: awake, alert , oriented and patient cooperative  Airway and Oxygen Therapy: Patient Spontanous Breathing  Post-op Pain: mild  Post-op Assessment: Patient's Cardiovascular Status Stable, Respiratory Function Stable, Patent Airway, No signs of Nausea or vomiting, Adequate PO intake and Pain level controlled  Post-op Vital Signs: Reviewed and stable  Complications: No apparent anesthesia complications

## 2013-01-24 NOTE — Anesthesia Preprocedure Evaluation (Signed)

## 2013-01-24 NOTE — Progress Notes (Signed)
Patient received to [facility] in short stay.  Bair paws gown on,  patient states she has had nothing to eat or drink, IV infusing, consent signed by patient but not MD.

## 2013-01-24 NOTE — Progress Notes (Signed)
Patient requests permanent sterilization D/W patient alternatives D/W patient procedure, risks including-infection, organ damage, bleeding/transfusion-HIV/Hep, DVT/PE, pneumonia. Permanence, failure rate, increased ectopic risk reviewed. All questions answered.

## 2013-01-24 NOTE — Anesthesia Postprocedure Evaluation (Signed)
  Anesthesia Post-op Note  Patient: Jessica Cowan  Procedure(s) Performed: Procedure(s): POST PARTUM TUBAL LIGATION (Bilateral)  Patient Location: Mother/Baby  Anesthesia Type:Epidural  Level of Consciousness: awake, alert , oriented and patient cooperative  Airway and Oxygen Therapy: Patient Spontanous Breathing  Post-op Pain: mild  Post-op Assessment: Patient's Cardiovascular Status Stable, Respiratory Function Stable, Patent Airway, No signs of Nausea or vomiting, Adequate PO intake and Pain level controlled  Post-op Vital Signs: Reviewed and stable  Complications: No apparent anesthesia complications

## 2013-01-24 NOTE — Progress Notes (Signed)
01/23/2013 - 01/24/2013  3:22 PM  PATIENT:  Jessica Cowan  37 y.o. female  PRE-OPERATIVE DIAGNOSIS:  desire sterilizaton  POST-OPERATIVE DIAGNOSIS:  desire sterilizaton  PROCEDURE:  Procedure(s): POST PARTUM TUBAL LIGATION (Bilateral)  SURGEON:  Surgeon(s) and Role:    * Leslie Andrea, MD - Primary  PHYSICIAN ASSISTANT:   ASSISTANTS: none   ANESTHESIA:   epidural  EBL:  Total I/O In: 1400 [I.V.:1400] Out: 705 [Urine:350; Blood:355]  BLOOD ADMINISTERED:none  DRAINS: none   LOCAL MEDICATIONS USED:  MARCAINE     SPECIMEN:  Source of Specimen:  bilateral fallopian tube segments  DISPOSITION OF SPECIMEN:  PATHOLOGY  COUNTS:  YES  TOURNIQUET:  * No tourniquets in log *  DICTATION: .Other Dictation: Dictation Number [id number]  PLAN OF CARE: Admit for overnight observation  PATIENT DISPOSITION:  PACU - hemodynamically stable.   Delay start of Pharmacological VTE agent (>24hrs) due to surgical blood loss or risk of bleeding: not applicable

## 2013-01-24 NOTE — Anesthesia Procedure Notes (Signed)

## 2013-01-24 NOTE — Anesthesia Preprocedure Evaluation (Signed)
Anesthesia Evaluation  Patient identified by MRN, date of birth, ID band Patient awake    Reviewed: Allergy & Precautions, H&P , Patient's Chart, lab work & pertinent test results  Airway Mallampati: II TM Distance: >3 FB Neck ROM: full    Dental no notable dental hx.    Pulmonary neg pulmonary ROS,  breath sounds clear to auscultation  Pulmonary exam normal       Cardiovascular negative cardio ROS  Rhythm:regular Rate:Normal     Neuro/Psych negative neurological ROS  negative psych ROS   GI/Hepatic negative GI ROS, Neg liver ROS,   Endo/Other  negative endocrine ROS  Renal/GU negative Renal ROS     Musculoskeletal   Abdominal   Peds  Hematology negative hematology ROS (+)   Anesthesia Other Findings   Reproductive/Obstetrics                           Anesthesia Physical Anesthesia Plan  ASA: II  Anesthesia Plan: Epidural   Post-op Pain Management:    Induction:   Airway Management Planned:   Additional Equipment:   Intra-op Plan:   Post-operative Plan:   Informed Consent: I have reviewed the patients History and Physical, chart, labs and discussed the procedure including the risks, benefits and alternatives for the proposed anesthesia with the patient or authorized representative who has indicated his/her understanding and acceptance.     Plan Discussed with:   Anesthesia Plan Comments:         Anesthesia Quick Evaluation

## 2013-01-24 NOTE — Progress Notes (Signed)
Delivery Note At 9:37 AM a viable female was delivered via  (Presentation: Left Occiput Anterior).  APGAR9: ,9 ; weight .   Placenta status:intact , .  Cord: 3 vessels with the following complications: None.  Cord pH: pending  Anesthesia:   Episiotomy: none Lacerations: first degree anterior not repaired, small second degree ML lac repaired with figure 8 Suture Repair: vicryl rapide Est. Blood Loss (mL): 350  Mom to postpartum.  Baby to nursery-stable.  Aailyah Dunbar II,Prudie Guthridge E 01/24/2013, 9:50 AM

## 2013-01-24 NOTE — Progress Notes (Signed)
Cx 4/90/-1/vtx FHT + accels, variable decels UCs q2-3 min Epidural in  AROM clear

## 2013-01-24 NOTE — Anesthesia Procedure Notes (Signed)
Epidural

## 2013-01-24 NOTE — Anesthesia Postprocedure Evaluation (Signed)
  Anesthesia Post Note  Patient: Jessica Cowan  Procedure(s) Performed: Procedure(s) (LRB): POST PARTUM TUBAL LIGATION (Bilateral)  Anesthesia type: Epidural  Patient location: PACU  Post pain: Pain level controlled  Post assessment: Post-op Vital signs reviewed  Last Vitals:  Filed Vitals:   01/24/13 1245  BP: 116/76  Pulse: 68  Temp: 36.8 C  Resp: 18    Post vital signs: Reviewed  Level of consciousness: awake  Complications: No apparent anesthesia complications

## 2013-01-24 NOTE — Plan of Care (Signed)
Problem: Phase II Progression Outcomes Goal: Incision intact & without signs/symptoms of infection Outcome: Progressing Pt had PP tubal ligation with dermabond

## 2013-01-24 NOTE — Progress Notes (Signed)
Delivery of live viable female by Dr Tomblin. APGARS 9,9  

## 2013-01-24 NOTE — Transfer of Care (Signed)
Immediate Anesthesia Transfer of Care Note  Patient: Jessica Cowan  Procedure(s) Performed: Procedure(s): POST PARTUM TUBAL LIGATION (Bilateral)  Patient Location: PACU  Anesthesia Type:Epidural  Level of Consciousness: awake, alert , oriented and patient cooperative  Airway & Oxygen Therapy: Patient Spontanous Breathing and Patient connected to nasal cannula oxygen  Post-op Assessment: Report given to PACU RN and Post -op Vital signs reviewed and stable  Post vital signs: Reviewed and stable  Complications: No apparent anesthesia complications

## 2013-01-25 LAB — CBC
Hemoglobin: 9.8 g/dL — ABNORMAL LOW (ref 12.0–15.0)
MCH: 30 pg (ref 26.0–34.0)
MCHC: 32 g/dL (ref 30.0–36.0)

## 2013-01-25 MED ORDER — PRENATAL MULTIVITAMIN CH: 1.0000 | ORAL_TABLET | Freq: Every day | ORAL | Status: DC

## 2013-01-25 MED ORDER — OXYCODONE-ACETAMINOPHEN 5-325 MG PO TABS: 1.0000 | ORAL_TABLET | Freq: Four times a day (QID) | ORAL | Status: DC | PRN

## 2013-01-25 MED ORDER — IBUPROFEN 600 MG PO TABS: 600.0000 mg | ORAL_TABLET | Freq: Four times a day (QID) | ORAL | Status: DC | PRN

## 2013-01-25 MED ORDER — MEASLES, MUMPS & RUBELLA VAC ~~LOC~~ INJ
0.5000 mL | INJECTION | Freq: Once | SUBCUTANEOUS | Status: AC
  Administered 2013-01-25: 0.5 mL via SUBCUTANEOUS
  Filled 2013-01-25: qty 0.5

## 2013-01-25 MED ORDER — BENZOCAINE-MENTHOL 20-0.5 % EX AERO: 1.0000 "application " | INHALATION_SPRAY | CUTANEOUS | Status: DC | PRN

## 2013-01-25 NOTE — Discharge Summary (Signed)
Obstetric Discharge Summary Reason for Admission: induction of labor Prenatal Procedures: ultrasound Intrapartum Procedures: spontaneous vaginal delivery Postpartum Procedures: P.P. tubal ligation Complications-Operative and Postpartum: none Hemoglobin  Date Value Range Status  01/25/2013 9.8* 12.0 - 15.0 g/dL Final     REPEATED TO VERIFY     DELTA CHECK NOTED     HCT  Date Value Range Status  01/25/2013 30.6* 36.0 - 46.0 % Final    Physical Exam:  General: alert, cooperative and no distress Lochia: appropriate Uterine Fundus: firm Incision: healing well DVT Evaluation: No evidence of DVT seen on physical exam.  Discharge Diagnoses: Term Pregnancy-delivered  Discharge Information: Date: 01/25/2013 Activity: pelvic rest Diet: routine Medications: PNV, Ibuprofen and Percocet Condition: stable Instructions: refer to practice specific booklet Discharge to: home   Newborn Data: Live born female  Birth Weight: 7 lb 7 oz (3374 g) APGAR: 9, 9  Home with mother.  Jessica Cowan,Jessica Cowan 01/25/2013, 9:24 AM

## 2013-01-25 NOTE — Progress Notes (Signed)
Feeling good, good pain relief, tolerating regular diet, ambulating, voiding  VSS Afeb  FFNT Incision healing well  A: Satisfactory  P: Patient wants to go home     Instructions given

## 2013-01-25 NOTE — Op Note (Signed)
NAMEMARIS, Jessica Cowan             ACCOUNT NO.:  [id number]  MEDICAL RECORD NO.:  [id number]  LOCATION:  9112                          FACILITY:  [facility]  PHYSICIAN:  Guy Sandifer. Henderson Cloud, M.D. DATE OF BIRTH:  [birth year]  DATE OF PROCEDURE:  01/24/2013 DATE OF DISCHARGE:                              OPERATIVE REPORT   PREOPERATIVE DIAGNOSIS:  Desires permanent sterilization.  POSTOPERATIVE DIAGNOSIS:  Desires permanent sterilization.  PROCEDURE:  Postpartum bilateral tubal ligation.  SURGEON:  Guy Sandifer. Henderson Cloud, M.D.  ANESTHESIA:  Epidural, Leilani Able, M.D.  ESTIMATED BLOOD LOSS:  Drops.  SPECIMENS:  Bilateral fallopian tube segments to Pathology.  INDICATIONS AND CONSENT:  This patient is a 37 year old, G2, P2, status post vaginal delivery of a healthy child earlier this a.m.  She desires permanent sterilization.  Options of contraception have been reviewed. Postpartum tubal ligation has been reviewed including the potential risks and complications, not limited to infection, organ damage, bleeding requiring transfusion of blood products with HIV and hepatitis acquisition, DVT, PE, and pneumonia.  Permanence of the procedure was emphasized.  Failure rate increased to ectopic risk reviewed.  All questions were answered and consent was signed on the chart.  PROCEDURE:  The patient was taken to the operating room where she was identified.  Her epidural anesthetic was augmented to the surgical level and she was placed in the dorsal supine position.  She was then prepped and draped in a sterile fashion.  Time-out was undertaken.  After testing for adequate epidural anesthesia, the infraumbilical area was infiltrated with approximately 6 mL of 0.25% plain Marcaine.  A small infraumbilical incision was made.  The fascia was identified, grasped with Kocher clamps, elevated, and entered with scissors without difficulty.  This was extended to approximately 2-3 cm.  A 0 Vicryl suture  was then placed around this margin of fascia under good visualization in a pursestring fashion.  The peritoneum was entered bluntly with a finger without difficulty.  The right fallopian tube was then identified from cornu to fimbria.  This was grasped in its mid ampullary portion with a Babcock clamp.  Knuckle of tube was doubly ligated with two free ties of plain suture.  The intervening knuckle was sharply resected.  Cautery was used to assure complete hemostasis. Approximately 10 mL of 0.25% Marcaine were then dripped on the tube. Same procedure was carried out on the left side with an additional 10 mL of 0.25% Marcaine as well.  Pack was removed.  The pursestring suture was then cinched down and tied under good visualization.  Palpation reveals it to close the fascia.  Skin was closed with a subcuticular 4-0 Vicryl suture.  Dressings were applied.  All counts were correct.  The patient was taken to the recovery room in stable condition.     Guy Sandifer Henderson Cloud, M.D.     JET/MEDQ  D:  01/24/2013  T:  01/25/2013  Job:  [id number]

## 2013-01-27 ENCOUNTER — Encounter (HOSPITAL_COMMUNITY): Payer: Self-pay | Admitting: Obstetrics and Gynecology

## 2013-06-24 ENCOUNTER — Other Ambulatory Visit: Payer: Self-pay | Admitting: Gynecology

## 2013-10-22 ENCOUNTER — Ambulatory Visit (INDEPENDENT_AMBULATORY_CARE_PROVIDER_SITE_OTHER): Payer: BC Managed Care – PPO | Admitting: Women's Health

## 2013-10-22 ENCOUNTER — Encounter: Payer: Self-pay | Admitting: Women's Health

## 2013-10-22 DIAGNOSIS — N898 Other specified noninflammatory disorders of vagina: Secondary | ICD-10-CM

## 2013-10-22 DIAGNOSIS — R350 Frequency of micturition: Secondary | ICD-10-CM

## 2013-10-22 LAB — WET PREP FOR TRICH, YEAST, CLUE
TRICH WET PREP: NONE SEEN
Yeast Wet Prep HPF POC: NONE SEEN

## 2013-10-22 MED ORDER — METRONIDAZOLE 0.75 % VA GEL: VAGINAL | Status: DC

## 2013-10-22 NOTE — Patient Instructions (Signed)
Bacterial Vaginosis Bacterial vaginosis is an infection of the vagina. It happens when too many of certain germs (bacteria) grow in the vagina. HOME CARE  Take your medicine as told by your doctor.  Finish your medicine even if you start to feel better.  Do not have sex until you finish your medicine and are better.  Tell your sex partner that you have an infection. They should see their doctor for treatment.  Practice safe sex. Use condoms. Have only one sex partner. GET HELP IF:  You are not getting better after 3 days of treatment.  You have more grey fluid (discharge) coming from your vagina than before.  You have more pain than before.  You have a fever. MAKE SURE YOU:   Understand these instructions.  Will watch your condition.  Will get help right away if you are not doing well or get worse. Document Released: 05/09/2008 Document Revised: 05/21/2013 Document Reviewed: 03/12/2013 [employer] Patient Information 2014 [employer].  

## 2013-10-22 NOTE — Progress Notes (Signed)
Patient ID: Jessica Cowan, female   DOB: 09/05/1975, 38 y.o.   MRN: [id number] Presents with complaint of vaginal discharge with odor. Irregular cycles/ breast-feeding/BTL. Daughter  9 months and has 42 year old son, both doing well. Mild urinary frequency denies pain or burning with urination and or abdominal pain. 2013, bilateral dermoid cysts removed pregnancy shortly after.  Exam: Appears well. External genitalia within normal limits, speculum exam scant amount of oh clear discharge noted, wet prep positive for amines, clues, TNTC bacteria. Bimanual no CMT or adnexal fullness or tenderness.  Bacteria vaginosis  Plan: MetroGel vaginal cream 1 applicator at bedtime x5, alcohol precautions reviewed, instructed to call if no relief of symptoms. Congratulated birth of daughter.

## 2013-10-23 LAB — URINALYSIS W MICROSCOPIC + REFLEX CULTURE
BACTERIA UA: NONE SEEN
BILIRUBIN URINE: NEGATIVE
Casts: NONE SEEN
Crystals: NONE SEEN
GLUCOSE, UA: NEGATIVE mg/dL
Hgb urine dipstick: NEGATIVE
KETONES UR: NEGATIVE mg/dL
Leukocytes, UA: NEGATIVE
Nitrite: NEGATIVE
PROTEIN: NEGATIVE mg/dL
Specific Gravity, Urine: 1.03 (ref 1.005–1.030)
UROBILINOGEN UA: 0.2 mg/dL (ref 0.0–1.0)
pH: 6.5 (ref 5.0–8.0)

## 2013-11-12 ENCOUNTER — Encounter: Payer: BC Managed Care – PPO | Admitting: Women's Health

## 2013-11-13 ENCOUNTER — Telehealth: Payer: Self-pay | Admitting: *Deleted

## 2013-11-13 DIAGNOSIS — N898 Other specified noninflammatory disorders of vagina: Secondary | ICD-10-CM

## 2013-11-13 MED ORDER — METRONIDAZOLE 0.75 % VA GEL: VAGINAL | Status: AC

## 2013-11-13 NOTE — Telephone Encounter (Signed)
Pt has ran out of Metrogel will send another Rx for to use 1 extra dose

## 2013-11-13 NOTE — Telephone Encounter (Signed)
Pt was prescribed Metrogel at bedtime x 5 on OV 10/22/13. Took 5 night dose 2 days later cycle started which lasted about 7-8 days, pt said while on cycle she noticed yellowish discharge with odor. So patient decided to use another dose of metrogel last night, she asked if should continue Metrogel? If so how many days? Recommendations? Please advise

## 2013-11-13 NOTE — Telephone Encounter (Signed)
Please call, she could use another dose of MetroGel and if symptoms remedied does not need to continue.

## 2014-06-15 ENCOUNTER — Encounter: Payer: Self-pay | Admitting: Women's Health

## 2018-03-29 ENCOUNTER — Other Ambulatory Visit (HOSPITAL_COMMUNITY): Payer: Self-pay | Admitting: *Deleted

## 2018-04-01 ENCOUNTER — Ambulatory Visit (HOSPITAL_COMMUNITY)
Admission: RE | Admit: 2018-04-01 | Discharge: 2018-04-01 | Disposition: A | Discharge status: Home / Self Care | Payer: BC Managed Care – PPO | Source: Ambulatory Visit | Attending: Obstetrics and Gynecology | Admitting: Obstetrics and Gynecology

## 2018-04-01 ENCOUNTER — Encounter (HOSPITAL_COMMUNITY): Payer: Self-pay

## 2018-04-01 DIAGNOSIS — D509 Iron deficiency anemia, unspecified: Secondary | ICD-10-CM | POA: Diagnosis not present

## 2018-04-01 MED ORDER — SODIUM CHLORIDE 0.9 % IV SOLN
510.0000 mg | INTRAVENOUS | Status: DC
  Administered 2018-04-01: 510 mg via INTRAVENOUS
  Filled 2018-04-01: qty 17

## 2018-04-01 NOTE — Discharge Instructions (Signed)

## 2018-04-04 ENCOUNTER — Encounter (HOSPITAL_COMMUNITY): Payer: BC Managed Care – PPO

## 2018-04-04 ENCOUNTER — Ambulatory Visit (HOSPITAL_COMMUNITY)
Admission: RE | Admit: 2018-04-04 | Discharge: 2018-04-04 | Disposition: A | Discharge status: Home / Self Care | Payer: BC Managed Care – PPO | Source: Ambulatory Visit | Attending: Obstetrics and Gynecology | Admitting: Obstetrics and Gynecology

## 2018-04-04 DIAGNOSIS — D509 Iron deficiency anemia, unspecified: Secondary | ICD-10-CM | POA: Insufficient documentation

## 2018-04-04 MED ORDER — SODIUM CHLORIDE 0.9 % IV SOLN
510.0000 mg | INTRAVENOUS | Status: AC
  Administered 2018-04-04: 510 mg via INTRAVENOUS
  Filled 2018-04-04: qty 17

## 2020-10-18 ENCOUNTER — Other Ambulatory Visit: Payer: Self-pay | Admitting: Obstetrics and Gynecology

## 2020-10-18 DIAGNOSIS — R928 Other abnormal and inconclusive findings on diagnostic imaging of breast: Secondary | ICD-10-CM

## 2020-11-03 ENCOUNTER — Other Ambulatory Visit: Payer: Self-pay

## 2020-11-03 ENCOUNTER — Ambulatory Visit
Admission: RE | Admit: 2020-11-03 | Discharge: 2020-11-03 | Disposition: A | Discharge status: Home / Self Care | Payer: BC Managed Care – PPO | Source: Ambulatory Visit | Attending: Obstetrics and Gynecology | Admitting: Obstetrics and Gynecology

## 2020-11-03 DIAGNOSIS — R928 Other abnormal and inconclusive findings on diagnostic imaging of breast: Secondary | ICD-10-CM

## 2021-08-21 IMAGING — MG MM DIGITAL DIAGNOSTIC UNILAT*L* W/ TOMO W/ CAD
4 series · 4 of 12 positions shown · non-contrast
Comparison: Previous exam(s).

CLINICAL DATA: 44-year-old female for evaluation of possible LEFT
breast mass on screening mammogram.

EXAM:
DIGITAL DIAGNOSTIC UNILATERAL LEFT MAMMOGRAM WITH TOMOSYNTHESIS AND
CAD; ULTRASOUND LEFT BREAST LIMITED
TECHNIQUE: Left digital diagnostic mammography and breast tomosynthesis was
performed. The images were evaluated with computer-aided detection.;
Targeted ultrasound examination of the left breast was performed

[L CC synth-2D]
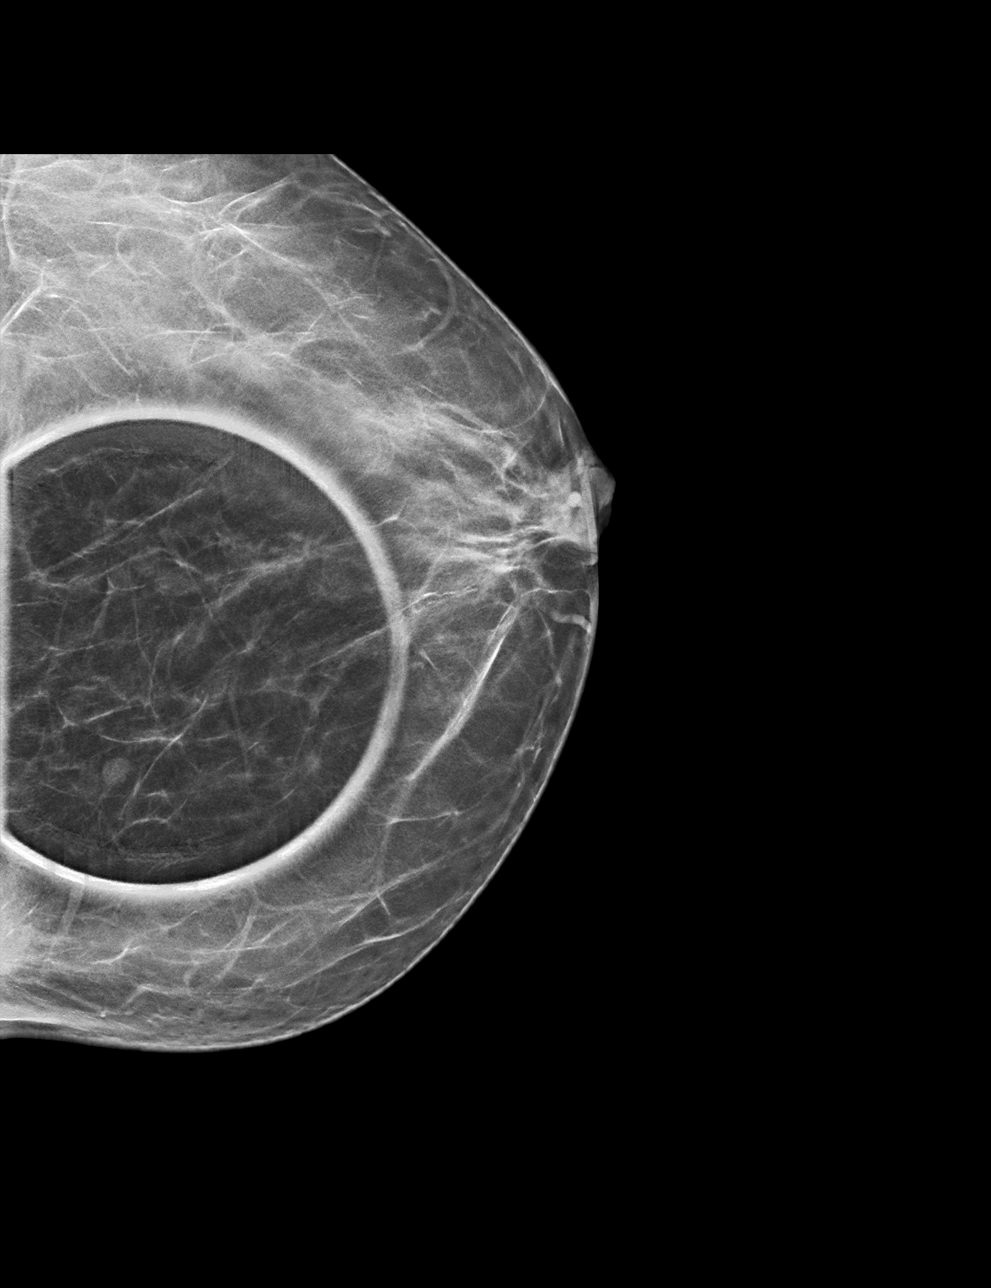

[L MLO synth-2D]
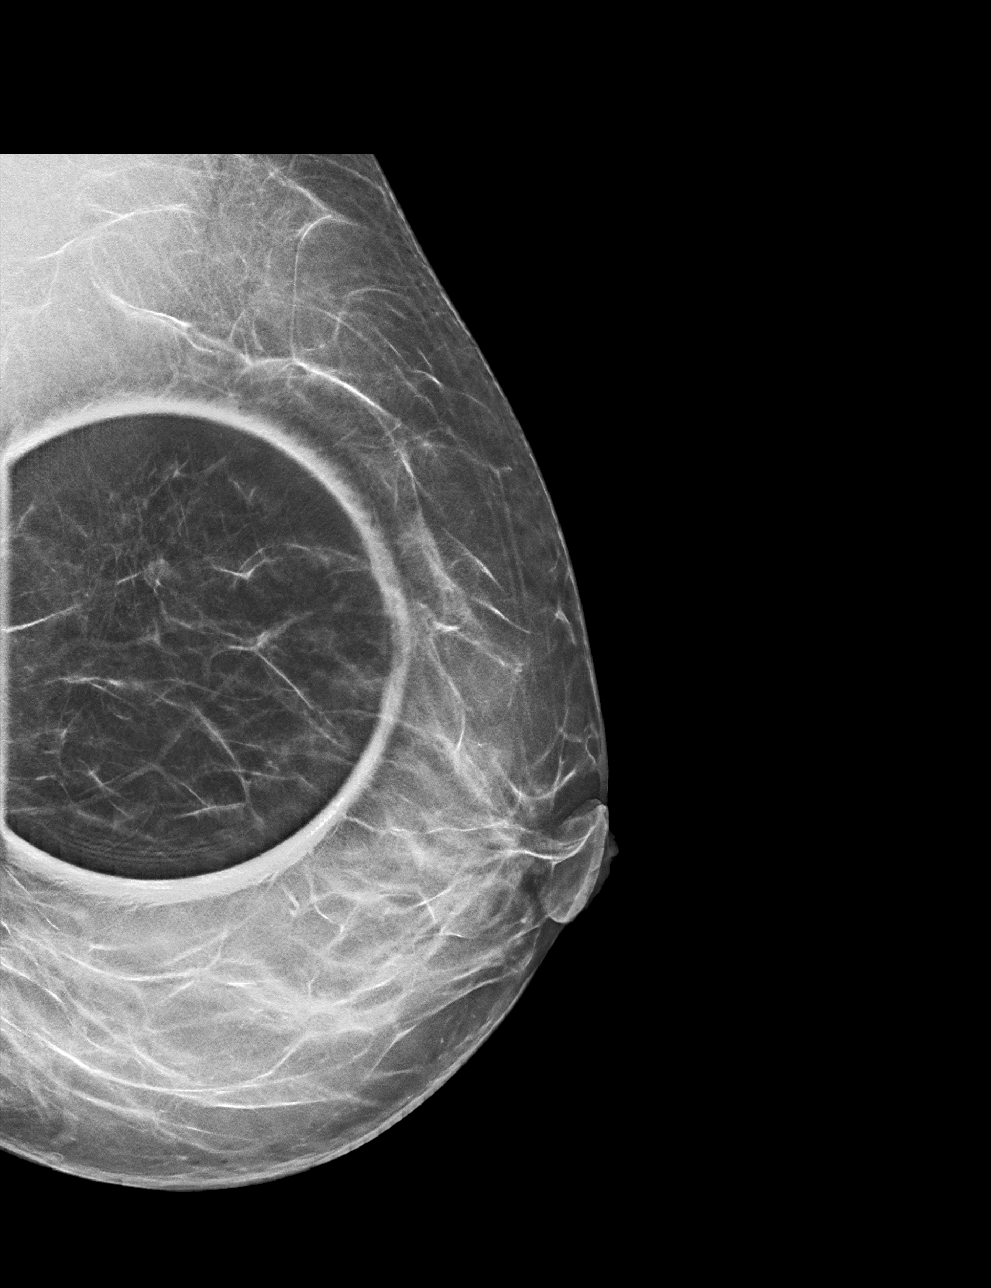

[L CC tomo · tomo slice 23/46.0]
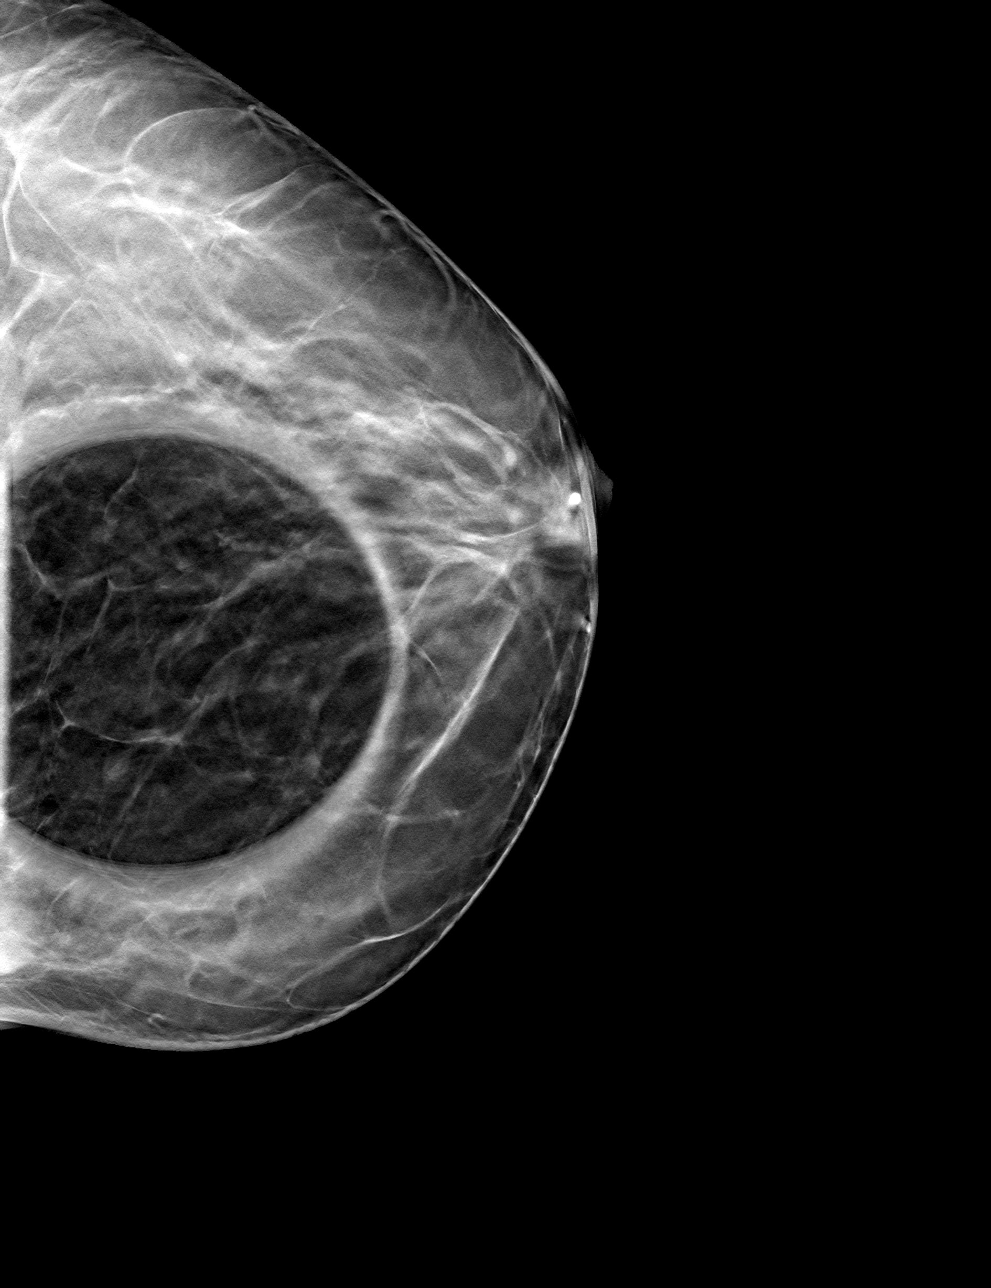

[L MLO tomo · tomo slice 24/47.0]
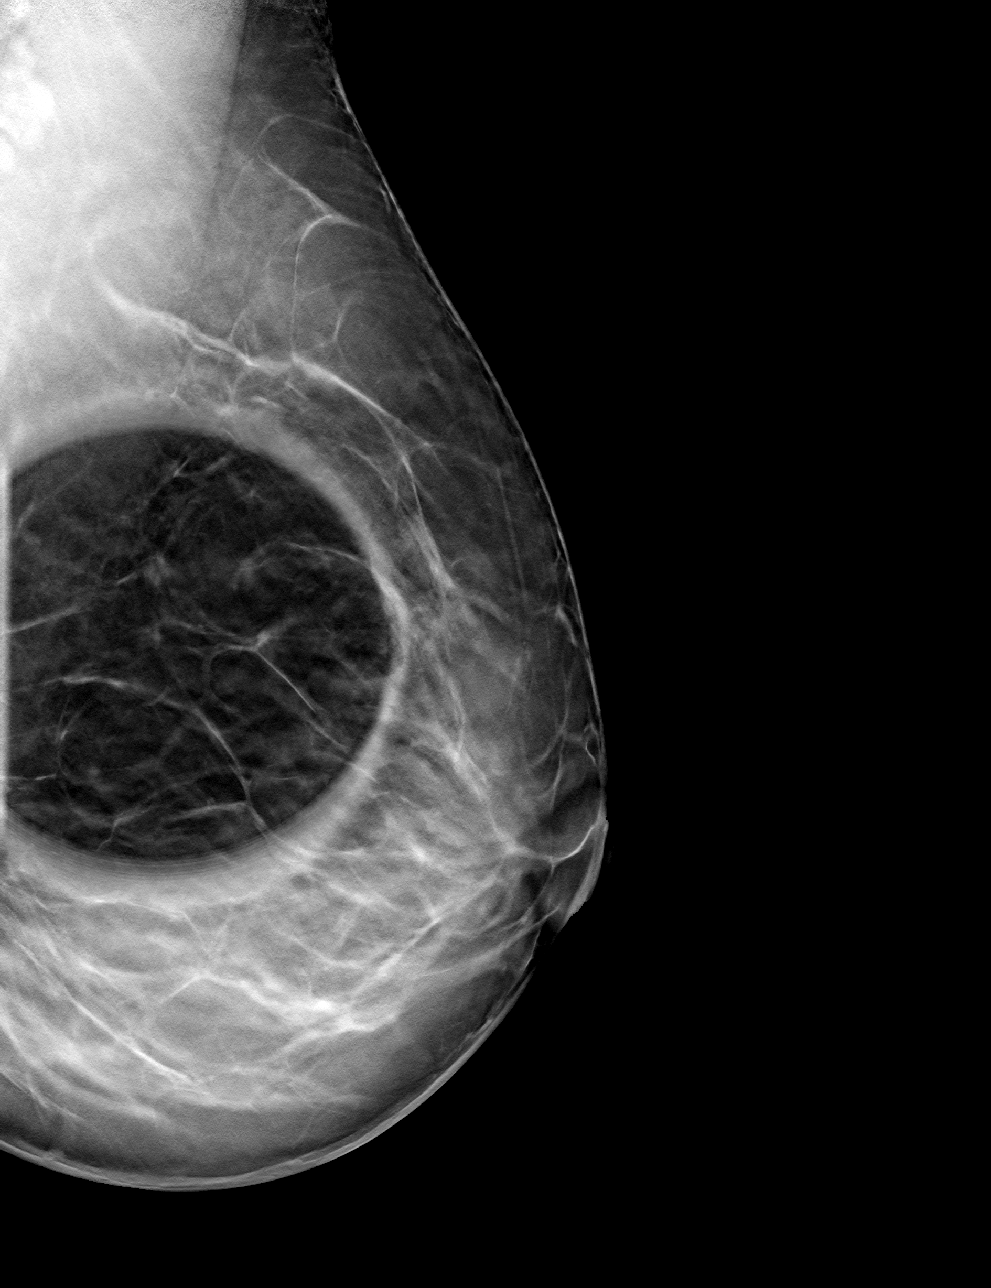

[4 of 12 positions shown; findings below may reference images not displayed]

ACR Breast Density Category b: There are scattered areas of
fibroglandular density.
FINDINGS: 2D/3D spot compression views of the LEFT breast demonstrate a
persistent circumscribed oval mass within the posterior UPPER INNER
LEFT breast.

Targeted ultrasound is performed, showing a 0.4 x 0.2 x 0.4 cm cyst
with connection to the skin at the 10 o'clock position of the LEFT
breast 10 cm from the nipple, corresponding to the screening study
finding.
IMPRESSION: Benign sebaceous cyst within the UPPER INNER LEFT breast
corresponding to the screening study finding.

RECOMMENDATION:
.  Bilateral screening mammogram in 1 year.

I have discussed the findings and recommendations with the patient.
If applicable, a reminder letter will be sent to the patient
regarding the next appointment.

BI-RADS CATEGORY  2: Benign.

## 2022-11-01 ENCOUNTER — Other Ambulatory Visit: Payer: Self-pay | Admitting: Internal Medicine

## 2022-11-01 ENCOUNTER — Ambulatory Visit
Admission: RE | Admit: 2022-11-01 | Discharge: 2022-11-01 | Disposition: A | Discharge status: Home / Self Care | Payer: BC Managed Care – PPO | Source: Ambulatory Visit | Attending: Internal Medicine | Admitting: Internal Medicine

## 2022-11-01 DIAGNOSIS — M5010 Cervical disc disorder with radiculopathy, unspecified cervical region: Secondary | ICD-10-CM

## 2023-10-02 ENCOUNTER — Ambulatory Visit
Admission: RE | Admit: 2023-10-02 | Discharge: 2023-10-02 | Disposition: A | Discharge status: Home / Self Care | Payer: 59 | Source: Ambulatory Visit | Attending: Internal Medicine | Admitting: Internal Medicine

## 2023-10-02 ENCOUNTER — Other Ambulatory Visit: Payer: Self-pay | Admitting: Internal Medicine

## 2023-10-02 DIAGNOSIS — R058 Other specified cough: Secondary | ICD-10-CM
